# Patient Record
Sex: Female | Born: 1935 | Race: White | Hispanic: No | Smoking: Never smoker
Health system: Southern US, Community
[De-identification: ages and names within clinical notes are randomized; demographics above are authoritative.]

## PROBLEM LIST (undated history)

## (undated) DIAGNOSIS — I482 Chronic atrial fibrillation, unspecified: Secondary | ICD-10-CM

## (undated) DIAGNOSIS — E079 Disorder of thyroid, unspecified: Secondary | ICD-10-CM

## (undated) DIAGNOSIS — I10 Essential (primary) hypertension: Secondary | ICD-10-CM

## (undated) HISTORY — DX: Chronic atrial fibrillation, unspecified: I48.20

## (undated) HISTORY — PX: OTHER SURGICAL HISTORY: SHX169

---

## 2009-09-15 ENCOUNTER — Ambulatory Visit: Payer: Self-pay | Admitting: Cardiovascular Disease

## 2009-11-20 ENCOUNTER — Ambulatory Visit: Payer: Self-pay | Admitting: Cardiovascular Disease

## 2009-12-01 ENCOUNTER — Ambulatory Visit: Payer: Self-pay | Admitting: Cardiovascular Disease

## 2009-12-08 ENCOUNTER — Telehealth: Payer: Self-pay | Admitting: Cardiovascular Disease

## 2010-02-02 ENCOUNTER — Ambulatory Visit
Admission: RE | Admit: 2010-02-02 | Discharge: 2010-02-02 | Payer: Self-pay | Source: Home / Self Care | Attending: Cardiovascular Disease | Admitting: Cardiovascular Disease

## 2010-02-03 NOTE — Progress Notes (Signed)
  Phone Note Call from Patient   Caller: Patient Summary of Call: T.C. from patient-since starting Digoxin last week, HR ranging from 69(the lowest) to 115.  Basically staying above 85.  BP 150's/70's.  Wants to know if Digoxin may be causing this or if needs to wait for it to work. Initial call taken by: Dessie Coma  LPN,  December 08, 2009 11:08 AM  Follow-up for Phone Call        Phone Call Completed, Jearld Hemp Notified:  LMVM per Dr. Kirke Corin, this is fine for now.  To call back if any more questions. Follow-up by: Dessie Coma  LPN,  December 08, 2009 3:27 PM

## 2010-02-16 NOTE — Assessment & Plan Note (Signed)
[facility]                       [facility] OFFICE NOTE  NAME:[name], [name]                      MRN:          [id number] DATE:02/02/2010                            DOB:          05/31/1915   [name] [name] is an 77 year old female who is here today for a followup visit.  She has the following problem list that include chronic atrial fibrillation.  INTERVAL HISTORY:  [name] [name] is here today for a followup visit. Overall, she has been doing very well with current medications that include metoprolol and digoxin.  She was taken off sotalol a few months ago.  She feels much better than before.  She is able to perform more activities with less fatigue.  During her last visit I added digoxin due to heart rate between 100 to 110.  Since then, there has been significant improvement in her heart rate.  She has not had any readings above 100 at rest.  There has been no chest pain or dyspnea.  MEDICATIONS: 1. Warfarin as directed. 2. Calcium. 3. Vitamin B. 4. Metoprolol tartrate 50 mg twice daily. 5. Digoxin 0.125 mg once daily.  PHYSICAL EXAMINATION:  VITAL SIGNS:  Weight is 152.2 pounds, blood pressure is 121/72, pulse is 77, oxygen saturation is 96% on room air. NECK:  No JVD or carotid bruits. LUNGS:  Clear to auscultation. HEART:  Irregularly irregular with no gallops or murmurs. ABDOMEN:  Benign, nontender, and nondistended. EXTREMITIES:  With no clubbing, cyanosis, or edema.  IMPRESSION:  Chronic atrial fibrillation, currently with controlled heart rate.  She will continue also long-term anticoagulation with warfarin.  We will continue with metoprolol and digoxin for rate control.  No other changes will be made today.  She will follow up with me in 4 months from now or earlier as needed.    Lorine Bears, MD Electronically Signed   MA/MedQ  DD: 02/02/2010  DT: 02/02/2010  Job #: [id number]

## 2010-05-11 ENCOUNTER — Encounter: Payer: Self-pay | Admitting: Cardiovascular Disease

## 2010-05-14 ENCOUNTER — Encounter: Payer: Self-pay | Admitting: Cardiovascular Disease

## 2010-05-14 ENCOUNTER — Ambulatory Visit (INDEPENDENT_AMBULATORY_CARE_PROVIDER_SITE_OTHER): Payer: Medicare Other | Admitting: Cardiovascular Disease

## 2010-05-14 VITALS — BP 125/72 | HR 76 | Ht 68.0 in | Wt 142.0 lb

## 2010-05-14 DIAGNOSIS — I4891 Unspecified atrial fibrillation: Secondary | ICD-10-CM

## 2010-05-14 DIAGNOSIS — I482 Chronic atrial fibrillation, unspecified: Secondary | ICD-10-CM

## 2010-05-14 NOTE — Assessment & Plan Note (Signed)
The patient is doing very well at this time with controlled ventricular rate. She's not having any side effects to medications. She has no symptoms suggestive of angina. Would continue with rate control and long-term anticoagulation with a goal INR between 2 and 3. She will followup in 6 months or earlier if needed.

## 2010-05-14 NOTE — Patient Instructions (Signed)
Your physician recommends that you schedule a follow-up appointment in: 6 months  

## 2010-05-14 NOTE — Progress Notes (Signed)
HPI  This is a 77 year old pleasant female who is here today for a followup visit regarding had chronic atrial fibrillation. Overall, she has been doing very well with no reported chest pain, dyspnea, palpitations, dizziness or syncope. She reports no side effects to medications. She used to be on sotalol in the past but I switched her to a rate control with metoprolol and digoxin. Since then, she had significant improvement in her fatigue and overall feels better. She is taking warfarin for anticoagulation with therapeutic INR which is being managed by her primary care physician [facility]).  No Known Allergies   Current Outpatient Prescriptions on File Prior to Visit  Medication Sig Dispense Refill  . ALPRAZolam (XANAX) 0.25 MG tablet Take 0.25 mg by mouth as needed.        . B Complex-C (B-COMPLEX WITH VITAMIN C) tablet Take 1 tablet by mouth daily.        . calcium carbonate (OS-CAL) 600 MG TABS Take 600 mg by mouth 2 (two) times daily with a meal.        . digoxin (LANOXIN) 0.125 MG tablet Take 125 mcg by mouth daily.        . metoprolol (LOPRESSOR) 50 MG tablet Take 50 mg by mouth 2 (two) times daily.        Marland Kitchen warfarin (COUMADIN) 5 MG tablet Take 5 mg by mouth as directed.           Past Medical History  Diagnosis Date  . Chronic atrial fibrillation     used to be on Sotalol in past     Past Surgical History  Procedure Date  . Hysterectomy - unknown type      Family History  Problem Relation Age of Onset  . Heart failure    . Dementia       History   Social History  . Marital Status: Widowed    Spouse Name: N/A    Number of Children: 2  . Years of Education: N/A   Occupational History  . retired Engineer, civil (consulting)    Social History Main Topics  . Smoking status: Never Smoker   . Smokeless tobacco: Not on file  . Alcohol Use: No  . Drug Use: No  . Sexually Active: Not on file   Other Topics Concern  . Not on file   Social History Narrative  . No narrative on file        PHYSICAL EXAM   BP 125/72  Pulse 76  Ht 5\' 8"  (1.727 m)  Wt 142 lb (64.411 kg)  BMI 21.59 kg/m2  SpO2 96%  Constitutional: She is oriented to person, place, and time. She appears well-developed and well-nourished. No distress.  HENT: No nasal discharge.  Head: Normocephalic and atraumatic.  Eyes: Pupils are equal, round, and reactive to light. Right eye exhibits no discharge. Left eye exhibits no discharge.  Neck: Normal range of motion. Neck supple. No JVD present. No thyromegaly present.  Cardiovascular: Normal rate, regular rhythm, normal heart sounds and intact distal pulses. Exam reveals no gallop and no friction rub.  No murmur heard.  Pulmonary/Chest: Effort normal and breath sounds normal. No stridor. No respiratory distress. She has no wheezes. She has no rales. She exhibits no tenderness.  Abdominal: Soft. Bowel sounds are normal. She exhibits no distension. There is no tenderness. There is no rebound and no guarding.  Musculoskeletal: Normal range of motion. She exhibits no edema and no tenderness.  Neurological: She is alert and oriented  to person, place, and time. Coordination normal.  Skin: Skin is warm and dry. No rash noted. She is not diaphoretic. No erythema. No pallor.  Psychiatric: She has a normal mood and affect. Her behavior is normal. Judgment and thought content normal.      ASSESSMENT AND PLAN

## 2010-05-19 NOTE — Letter (Signed)
September 15, 2009    [name] Alken, NP  [address]  [address]  [place], [place] [address]   RE:  [name], [name]  MRN:  [id number]  /  DOB:  Apr 01, 1915   Dear Amy:   Thank you for referring [name] [name] for further cardiac evaluation.  As you are aware, this is a pleasant 77 year old female with a history  of paroxysmal atrial fibrillation which according to her was diagnosed  30 years ago.  She has been on sotalol and anticoagulation with warfarin  for the past 8 years.  Otherwise, she is actually a very healthy person  with no other chronic medical conditions.  There is no history of  coronary artery disease, stroke, diabetes, or chronic kidney disease.  Overall, she has been doing well but now she is transferring her cardiac  care.  Most recently she has noticed increased palpitations leading to  dizziness and lightheadedness.  She also feels weak with these episodes.  She monitors her pulse and the lowest heart rate she had was 55.  During  one of these episodes, she checked her blood pressure and it was a  systolic of 85.  As I mentioned, she has been on sotalol for 8 years  which for the most part has been controlling her symptoms.  There has  been some worsening in her palpitations recently but does not seem to be  interfering with her activities of daily living.  She is also asking  whether a pacemaker is needed.  There is no reported history of  presyncope or syncope.  She has been on anticoagulation with no reported  side effects.   PAST MEDICAL HISTORY:  Includes paroxysmal atrial fibrillation.   MEDICATIONS:  1. Warfarin as directed.  2. Sotalol 80 mg 1/2 a tablet twice daily.   ALLERGIES:  NO KNOWN DRUG ALLERGIES.   SOCIAL HISTORY:  Negative for smoking, alcohol, or recreational drug  use.  The patient is widowed and lives by herself.  She is a retired  Engineer, civil (consulting).   PAST SURGICAL HISTORY:  Includes a hysterectomy.   FAMILY HISTORY:  Negative for  premature coronary artery disease or  arrhythmia.   REVIEW OF SYSTEMS:  Remarkable for palpitations, dizziness, and fatigue.  There is also arthritis pain.  She has no dyspnea or chest pain.  There  is no syncope.  A full review of systems was performed and is otherwise  negative.   PHYSICAL EXAMINATION:  The patient appears to be younger than her stated  age and in no acute distress.  VITAL SIGNS:  Weight is 145 pounds.  Blood pressure is 138/66.  Pulse is  66.  Oxygen saturation is 98% on room air.  HEENT:  Normocephalic, atraumatic.  NECK:  Reveals no JVD or carotid bruits.  RESPIRATORY:  Normal respiratory effort with no use of accessory  muscles.  Auscultation reveals normal breath sounds.  CARDIOVASCULAR:  Regular rate with frequent premature beats.  There are  no gallops or murmurs.  ABDOMEN:  Benign, nontender, nondistended.  EXTREMITIES:  With no clubbing, cyanosis, or edema.  SKIN:  Warm and dry with no rash.  PSYCHIATRIC:  She is alert, oriented x3, with normal mood and affect.  MUSCULOSKELETAL:  There is normal muscle strength in the upper and lower  extremities.   Electrocardiogram was performed which showed sinus rhythm with frequent  PACs, low voltage, and poor R-wave progression in the anterior leads.   IMPRESSION:  Paroxysmal atrial fibrillation:  The patient had this for  many years and for the most part her symptoms have been controlled with  sotalol.  Most recently her palpitations have become more frequent and  seems to be a little bit bothering her.  There are no symptoms of  syncope or presyncope.  Her current EKG shows sinus rhythm with frequent  PACs.  The QT interval is not prolonged.  According to patient, she did  have workup with her cardiologist within the last year which included an  echocardiogram and a Holter monitor.  We will request these records so  that we do not repeat anymore testing.  Management options include  switching her to a different  antiarrhythmic medication such as Multaq.  I think that will depend on her previous workup, as well as the severity  of her symptoms.  Sotalol tends to cause more bradycardia than other  antiarrhythmic medications which might be causing some of her fatigue.  In that situation, switching to another medication might be worth  trying.  In all situations, I recommend continuing long-term  anticoagulation with warfarin due to her advanced age.  She did have  labs done recently which showed normal thyroid function and normal renal  function.  The patient will follow up with me in 2 months from now or  earlier if needed.  We did discuss the issue of a pacemaker.  Given that  the lowest heart rate that she had was 55 beats per minute and she  reports no syncope or presyncope, I do not see an indication for a  permanent pacemaker.  Occasionally, if her atrial fibrillation becomes  out of control and a higher dose of rate-controlling agent is needed,  then in that situation a pacemaker might be needed to achieve that.   Thank you for referring this pleasant patient to our practice.  Please  do not hesitate to call us with any questions or concerns.    Sincerely,      [name] Bears, MD  Electronically Signed    MA/MedQ  DD: 09/15/2009  DT: 09/15/2009  Job #: [id number]

## 2010-05-19 NOTE — Assessment & Plan Note (Signed)
[facility]                        [place] [facility] OFFICE NOTE   NAME:[name] [name]                      MRN:          [id number]  DATE:12/01/2009                            DOB:          September 12, 1915    Ms. [name] [name] is a 77 year old female who is here today for a followup  visit.  She has the following problem list:  1. Atrial fibrillation.  2. No previous history of coronary artery disease or stroke.   Ms. [name] [name] is here today for 1-week followup visit.  During her last  visit, I stopped sotalol and switched her to metoprolol tartrate 50 mg  twice a day.  I decided to treat her with rate control given that she  was in atrial fibrillation in spite of being on sotalol.  Since that  time, she had significant improvement in her symptoms.  She does not  feel tired anymore.  She has more energy and able to do more things.  Her palpitations are also less than before.  She does feel that her  heart is slightly fast but that does not seem to be interfering with her  activities of daily living.   MEDICATIONS:  1. Warfarin as directed.  2. Metoprolol tartrate 50 mg twice daily.  3. Calcium once a day.  4. Vitamin B.   PHYSICAL EXAMINATION:  VITAL SIGNS:  Weight is 145.2 pounds, blood  pressure is 116/67, pulse is 99, oxygen saturation is 97% on room air.  NECK:  Reveals no JVD or carotid bruits.  LUNGS:  Clear to auscultation.  HEART:  Irregularly irregular with no gallops or murmurs.  ABDOMEN:  Benign, nontender, nondistended.  EXTREMITIES:  With no clubbing, cyanosis, or edema.   IMPRESSION:  Atrial fibrillation, likely now in chronic phase.   I decided to treat her with rate control.  I stopped sotalol due to  concern about possible side effects.  Since then, she has been feeling  much better.  She had significant symptomatic improvement.  Her heart  rate by physical exam is actually between 100 to 110.  Thus, she does  seem to be  slightly tachycardic.  Due to that, I will go ahead and add  digoxin 0.125 mg once daily.  We will continue with long-term  anticoagulation with warfarin.  The patient will update [place] if  there is any change in her symptoms while on digoxin.  She has normal  renal function.  Her labs  in September with a creatinine of 0.84.  She will follow up with me in 2  months from now or earlier if needed.     [name] Bears, MD  Electronically Signed    MA/MedQ  DD: 12/01/2009  DT: 12/02/2009  Job #: [id number]

## 2010-05-19 NOTE — Assessment & Plan Note (Signed)
[facility]                        [place] [facility] OFFICE NOTE   NAME:Bradshaw, Teasha                      MRN:          [id number]  DATE:11/20/2009                            DOB:          June 25, 1915    Ms. Heinzel is a 77 year old female who is here today for a followup  visit.  She has the following problem list:  1. Paroxysmal atrial fibrillation.  2. No previous history of coronary artery disease or stroke.   Ms. Bribiesca is here today for a routine followup visit.  She has been  having increased symptoms of palpitations which has been progressive  over the last 6 months.  She has been on sotalol for many years.  She  feels that over the last 6 months, she has been having more palpitations  than before.  She continues to take long-term anticoagulation with  warfarin.  The palpitations are not associated with dizziness,  presyncope, or syncope.  There has been no chest pain or dyspnea.   MEDICATIONS:  1. Warfarin as directed.  2. Sotalol 40 mg twice daily.  3. Calcium once daily.  4. Vitamin B once daily.   ALLERGIES:  No known drug allergies.   PHYSICAL EXAMINATION:  VITAL SIGNS:  Weight is 145.2 pounds, blood  pressure is 131/84, pulse is 76, oxygen saturation is 97% on room air.  NECK:  No JVD or carotid bruits.  LUNGS:  Clear to auscultation.  HEART:  Irregularly irregular with tachycardia.  There are no gallops or  murmurs.  ABDOMEN:  Benign, nontender, nondistended.  EXTREMITIES:  With no clubbing, cyanosis, or edema.   Electrocardiogram was performed which showed atrial fibrillation with  rapid ventricular response.  Heart rate is 104 with nonspecific ST and T-  wave changes.  QT interval is normal.   IMPRESSION:  Atrial fibrillation, likely transitioning into a permanent  phase.  The patient has been on sotalol for many years.  However, over  the last 6 months, she has been having significant breakthrough  palpitations  likely indicative of atrial fibrillation.  Her most recent  cardiac evaluation was done in 2010.  Echocardiogram in December 2010  showed normal LV systolic function, moderately dilated left atrium, mild  mitral and tricuspid regurgitation as well as aortic sclerosis without  stenosis.  Her Holter monitor in March 2010 showed episodes of atrial  fibrillation but mostly sinus rhythm with frequent PACs.  I had a  discussion with the patient about management options which include  either rhythm control or rate control strategies.  It seems like most of  the time when she is in fibrillation, she is not very symptomatic except  for some palpitations.  Due to that and the fact that she is 89-year-  old, I think rate control will be sufficient as well as continuing long-  term anticoagulation to decrease her chance of future stroke.  Thus  today, I will go ahead and stop sotalol and switch her to metoprolol  tartrate 50 mg twice daily.  We will evaluate her response next week.  If we are having hard time controlling her rhythm  or if she becomes  bradycardic then I might consider switching to another antiarrhythmic  medication which most likely will be amiodarone in that situation.     Lorine Bears, MD  Electronically Signed    MA/MedQ  DD: 11/20/2009  DT: 11/21/2009  Job #: [id number]

## 2010-06-22 ENCOUNTER — Encounter: Payer: Self-pay | Admitting: Cardiovascular Disease

## 2010-07-20 ENCOUNTER — Other Ambulatory Visit: Payer: Self-pay | Admitting: *Deleted

## 2010-07-20 MED ORDER — DIGOXIN 125 MCG PO TABS: 125.0000 ug | ORAL_TABLET | Freq: Every day | ORAL | Status: DC

## 2010-12-10 ENCOUNTER — Other Ambulatory Visit: Payer: Self-pay | Admitting: Cardiovascular Disease

## 2011-02-22 ENCOUNTER — Other Ambulatory Visit: Payer: Self-pay | Admitting: Cardiovascular Disease

## 2011-05-05 ENCOUNTER — Encounter: Payer: Self-pay | Admitting: Cardiovascular Disease

## 2011-05-05 ENCOUNTER — Ambulatory Visit (INDEPENDENT_AMBULATORY_CARE_PROVIDER_SITE_OTHER): Payer: Medicare Other | Admitting: Cardiovascular Disease

## 2011-05-05 VITALS — BP 120/85 | HR 78 | Ht 67.0 in | Wt 143.0 lb

## 2011-05-05 DIAGNOSIS — I4891 Unspecified atrial fibrillation: Secondary | ICD-10-CM

## 2011-05-05 DIAGNOSIS — I482 Chronic atrial fibrillation, unspecified: Secondary | ICD-10-CM

## 2011-05-05 MED ORDER — DIGOXIN 125 MCG PO TABS: 125.0000 ug | ORAL_TABLET | Freq: Every day | ORAL | Status: DC

## 2011-05-05 MED ORDER — METOPROLOL TARTRATE 50 MG PO TABS: 50.0000 mg | ORAL_TABLET | Freq: Two times a day (BID) | ORAL | Status: DC

## 2011-05-05 NOTE — Progress Notes (Signed)
HPI  This is a 78 year old female who is here today for a followup visit. She has known history of chronic atrial fibrillation being treated with rate control and anticoagulation. She has been doing very well. She denies any dyspnea, chest pain or palpitations. She is very active and able to perform all activities of daily living. She continues to drive. She reports no side effects to medications.  No Known Allergies   Current Outpatient Prescriptions on File Prior to Visit  Medication Sig Dispense Refill  . B Complex-C (B-COMPLEX WITH VITAMIN C) tablet Take 1 tablet by mouth daily.        . calcium carbonate (OS-CAL) 600 MG TABS Take 600 mg by mouth 2 (two) times daily with a meal.        . warfarin (COUMADIN) 5 MG tablet Take 5 mg by mouth as directed.        Marland Kitchen DISCONTD: digoxin (LANOXIN) 0.125 MG tablet Take 1 tablet (125 mcg total) by mouth daily.  90 tablet  2  . DISCONTD: metoprolol (LOPRESSOR) 50 MG tablet Take 1 tablet (50 mg total) by mouth 2 (two) times daily.  60 tablet  1  . ALPRAZolam (XANAX) 0.25 MG tablet Take 0.25 mg by mouth as needed.           Past Medical History  Diagnosis Date  . Chronic atrial fibrillation     used to be on Sotalol in past     Past Surgical History  Procedure Date  . Hysterectomy - unknown type      Family History  Problem Relation Age of Onset  . Heart failure    . Dementia       History   Social History  . Marital Status: Widowed    Spouse Name: N/A    Number of Children: 2  . Years of Education: N/A   Occupational History  . retired Engineer, civil (consulting)    Social History Main Topics  . Smoking status: Never Smoker   . Smokeless tobacco: Not on file  . Alcohol Use: No  . Drug Use: No  . Sexually Active: Not on file   Other Topics Concern  . Not on file   Social History Narrative  . No narrative on file     PHYSICAL EXAM   BP 120/85  Pulse 78  Ht 5\' 7"  (1.702 m)  Wt 143 lb (64.864 kg)  BMI 22.40  kg/m2  Constitutional: She is oriented to person, place, and time. She appears well-developed and well-nourished. No distress.  HENT: No nasal discharge.  Head: Normocephalic and atraumatic.  Eyes: Pupils are equal and round. Right eye exhibits no discharge. Left eye exhibits no discharge.  Neck: Normal range of motion. Neck supple. No JVD present. No thyromegaly present.  Cardiovascular: Normal rate, irregular rhythm, normal heart sounds. Exam reveals no gallop and no friction rub. No murmur heard.  Pulmonary/Chest: Effort normal and breath sounds normal. No stridor. No respiratory distress. She has no wheezes. She has no rales. She exhibits no tenderness.  Abdominal: Soft. Bowel sounds are normal. She exhibits no distension. There is no tenderness. There is no rebound and no guarding.  Musculoskeletal: Normal range of motion. She exhibits no edema and no tenderness.  Neurological: She is alert and oriented to person, place, and time. Coordination normal.  Skin: Skin is warm and dry. No rash noted. She is not diaphoretic. No erythema. No pallor.  Psychiatric: She has a normal mood and affect. Her behavior  is normal. Judgment and thought content normal.    EKG: Atrial fibrillation with nonspecific ST changes suggestive of digoxin effect.   ASSESSMENT AND PLAN

## 2011-05-05 NOTE — Assessment & Plan Note (Signed)
The patient is doing very well at this time with controlled ventricular rate. She's not having any side effects to medications. She has no symptoms suggestive of angina. continue with rate control and long-term anticoagulation with a goal INR between 2 and 3. Most recent echocardiogram in 2010 showed normal LV systolic function, moderately dilated left atrium as well as mild mitral and tricuspid regurgitation. I recommended checking renal function and digoxin level once a year. She will followup in 6 months or earlier if needed.

## 2011-05-05 NOTE — Patient Instructions (Signed)
The current medical regimen is effective;  continue present plan and medications.  Follow up in 6 months with Dr Kirke Corin.  You will receive a letter in the mail 2 months before you are due.  Please call us when you receive this letter to schedule your follow up appointment.

## 2012-02-23 ENCOUNTER — Encounter: Payer: Self-pay | Admitting: Cardiovascular Disease

## 2012-02-23 ENCOUNTER — Ambulatory Visit (INDEPENDENT_AMBULATORY_CARE_PROVIDER_SITE_OTHER): Payer: Medicare Other | Admitting: Cardiovascular Disease

## 2012-02-23 VITALS — BP 110/60 | HR 76 | Ht 68.0 in | Wt 146.0 lb

## 2012-02-23 DIAGNOSIS — I482 Chronic atrial fibrillation, unspecified: Secondary | ICD-10-CM

## 2012-02-23 DIAGNOSIS — R600 Localized edema: Secondary | ICD-10-CM | POA: Insufficient documentation

## 2012-02-23 DIAGNOSIS — I4891 Unspecified atrial fibrillation: Secondary | ICD-10-CM

## 2012-02-23 MED ORDER — DIGOXIN 125 MCG PO TABS: 125.0000 ug | ORAL_TABLET | Freq: Every day | ORAL | Status: DC

## 2012-02-23 NOTE — Patient Instructions (Addendum)
Your physician wants you to follow-up in: 6 MONTHS WITH DR Kirke Corin You will receive a reminder letter in the mail two months in advance. If you don't receive a letter, please call our office to schedule the follow-up appointment.   USE SUPPORT HOSE DURING THE DAY AND REMOVE AT NIGHT  ELEVATE LEGS 15 MIN TWICE DAILY

## 2012-02-23 NOTE — Progress Notes (Signed)
HPI  This is a pleasant 79 year old female who is here today for a followup visit. She has known history of chronic atrial fibrillation being treated with rate control and anticoagulation. She has been doing very well. She denies any dyspnea, chest pain or palpitations. She is very active and able to perform all activities of daily living. She continues to drive. She reports no side effects to medications. Since last visit, she reports having pneumonia but did not require hospitalization. She also had a fall with laceration on her head but no other significant injuries. Her biggest issue today is lower extremity edema which is worse at the end of the day. She has no orthopnea or other cardiac symptoms.  No Known Allergies   Current Outpatient Prescriptions on File Prior to Visit  Medication Sig Dispense Refill  . B Complex-C (B-COMPLEX WITH VITAMIN C) tablet Take 1 tablet by mouth daily.        . calcium carbonate (OS-CAL) 600 MG TABS Take 600 mg by mouth 2 (two) times daily with a meal.        . metoprolol (LOPRESSOR) 50 MG tablet Take 1 tablet (50 mg total) by mouth 2 (two) times daily.  180 tablet  3  . warfarin (COUMADIN) 5 MG tablet Take 5 mg by mouth as directed.         No current facility-administered medications on file prior to visit.     Past Medical History  Diagnosis Date  . Chronic atrial fibrillation     used to be on Sotalol in past     Past Surgical History  Procedure Laterality Date  . Hysterectomy - unknown type       Family History  Problem Relation Age of Onset  . Heart failure    . Dementia       History   Social History  . Marital Status: Widowed    Spouse Name: N/A    Number of Children: 2  . Years of Education: N/A   Occupational History  . retired Engineer, civil (consulting)    Social History Main Topics  . Smoking status: Never Smoker   . Smokeless tobacco: Not on file  . Alcohol Use: No  . Drug Use: No  . Sexually Active: Not on file   Other Topics  Concern  . Not on file   Social History Narrative  . No narrative on file     PHYSICAL EXAM   BP 110/60  Pulse 76  Ht 5\' 8"  (1.727 m)  Wt 146 lb (66.225 kg)  BMI 22.2 kg/m2  Constitutional: She is oriented to person, place, and time. She appears well-developed and well-nourished. No distress.  HENT: No nasal discharge.  Head: Normocephalic and atraumatic.  Eyes: Pupils are equal and round. Right eye exhibits no discharge. Left eye exhibits no discharge.  Neck: Normal range of motion. Neck supple. No JVD present. No thyromegaly present.  Cardiovascular: Normal rate, irregular rhythm, normal heart sounds. Exam reveals no gallop and no friction rub. No murmur heard.  Pulmonary/Chest: Effort normal and breath sounds normal. No stridor. No respiratory distress. She has no wheezes. She has no rales. She exhibits no tenderness.  Abdominal: Soft. Bowel sounds are normal. She exhibits no distension. There is no tenderness. There is no rebound and no guarding.  Musculoskeletal: Normal range of motion. She exhibits mild edema and no tenderness.  Neurological: She is alert and oriented to person, place, and time. Coordination normal.  Skin: Skin is warm and  dry. No rash noted. She is not diaphoretic. No erythema. No pallor.  Psychiatric: She has a normal mood and affect. Her behavior is normal. Judgment and thought content normal.    EKG: Atrial fibrillation with nonspecific ST changes suggestive of digoxin effect.   ASSESSMENT AND PLAN

## 2012-02-23 NOTE — Assessment & Plan Note (Signed)
I suspect that this is due to chronic venous insufficiency. I advised her to elevate her legs 2-3 times a day for at least 15 minutes. I also recommended a knee-high low-pressure support stocking to be applied during the day. I will try to avoid diuretics if possible.

## 2012-02-23 NOTE — Assessment & Plan Note (Addendum)
She is doing very well from a cardiac standpoint. Ventricular rate is controlled on current medications. Continue long-term anticoagulation. I advised her to be very careful about falls and trauma given that she is on warfarin. She gets routine labs done with her primary care physician, Amy Moon in [place]. I recommend checking CBC, basic metabolic profile and digoxin level at least once a year.

## 2012-04-17 ENCOUNTER — Other Ambulatory Visit: Payer: Self-pay | Admitting: Cardiovascular Disease

## 2012-09-19 ENCOUNTER — Encounter: Payer: Self-pay | Admitting: Cardiovascular Disease

## 2012-09-19 ENCOUNTER — Ambulatory Visit (INDEPENDENT_AMBULATORY_CARE_PROVIDER_SITE_OTHER): Payer: Medicare Other | Admitting: Cardiovascular Disease

## 2012-09-19 VITALS — BP 122/62 | HR 64 | Ht 68.0 in | Wt 141.0 lb

## 2012-09-19 DIAGNOSIS — R296 Repeated falls: Secondary | ICD-10-CM | POA: Insufficient documentation

## 2012-09-19 DIAGNOSIS — I4891 Unspecified atrial fibrillation: Secondary | ICD-10-CM

## 2012-09-19 DIAGNOSIS — I482 Chronic atrial fibrillation, unspecified: Secondary | ICD-10-CM

## 2012-09-19 DIAGNOSIS — R600 Localized edema: Secondary | ICD-10-CM

## 2012-09-19 DIAGNOSIS — W19XXXA Unspecified fall, initial encounter: Secondary | ICD-10-CM | POA: Insufficient documentation

## 2012-09-19 DIAGNOSIS — R609 Edema, unspecified: Secondary | ICD-10-CM

## 2012-09-19 LAB — BASIC METABOLIC PANEL
BUN: 15 mg/dL (ref 6–23)
Creatinine, Ser: 0.9 mg/dL (ref 0.4–1.2)
GFR: 65.19 mL/min (ref >60.00)

## 2012-09-19 NOTE — Patient Instructions (Addendum)
Your physician has recommended that you wear a 48 hour holter monitor. Holter monitors are medical devices that record the heart's electrical activity. Doctors most often use these monitors to diagnose arrhythmias. Arrhythmias are problems with the speed or rhythm of the heartbeat. The monitor is a small, portable device. You can wear one while you do your normal daily activities. This is usually used to diagnose what is causing palpitations/syncope (passing out).  Your physician recommends that you have lab work today: BMP and Digoxin  Your physician wants you to follow-up in: 6 MONTHS with Dr Kirke Corin.  You will receive a reminder letter in the mail two months in advance. If you don't receive a letter, please call our office to schedule the follow-up appointment.  Your physician recommends that you continue on your current medications as directed. Please refer to the Current Medication list given to you today.  Order given to the patient for walker with wheels.

## 2012-09-19 NOTE — Progress Notes (Signed)
HPI  This is a pleasant 79 year old female who is here today for a followup visit. She has known history of chronic atrial fibrillation being treated with rate control and anticoagulation. She has been doing very well. She denies any dyspnea, chest pain or palpitations. She is very active and able to perform all activities of daily living. She stopped driving. She lives in an assisted living facility.  No extremity edema improved with support stockings. She denies chest pain, dyspnea or palpitations. She had another fall without physical injury. These falls are happening mostly due to tripping and poor balance and not due to syncope. She complains of being "lifeless"with no energy in the morning after she wakes up. This improved gradually throughout the day. She takes digoxin at night.   No Known Allergies   Current Outpatient Prescriptions on File Prior to Visit  Medication Sig Dispense Refill  . B Complex-C (B-COMPLEX WITH VITAMIN C) tablet Take 1 tablet by mouth daily.        . calcium carbonate (OS-CAL) 600 MG TABS Take 600 mg by mouth 2 (two) times daily with a meal.        . digoxin (LANOXIN) 0.125 MG tablet Take 1 tablet (125 mcg total) by mouth daily.  90 tablet  3  . metoprolol (LOPRESSOR) 50 MG tablet TAKE ONE TABLET BY MOUTH TWICE DAILY  180 tablet  1   No current facility-administered medications on file prior to visit.     Past Medical History  Diagnosis Date  . Chronic atrial fibrillation     used to be on Sotalol in past     Past Surgical History  Procedure Laterality Date  . Hysterectomy - unknown type       Family History  Problem Relation Age of Onset  . Heart failure    . Dementia       History   Social History  . Marital Status: Widowed    Spouse Name: N/A    Number of Children: 2  . Years of Education: N/A   Occupational History  . retired Engineer, civil (consulting)    Social History Main Topics  . Smoking status: Never Smoker   . Smokeless tobacco: Not on file   . Alcohol Use: No  . Drug Use: No  . Sexual Activity: Not on file   Other Topics Concern  . Not on file   Social History Narrative  . No narrative on file     PHYSICAL EXAM   BP 122/62  Pulse 64  Ht 5\' 8"  (1.727 m)  Wt 141 lb (63.957 kg)  BMI 21.44 kg/m2  SpO2 96%  Constitutional: She is oriented to person, place, and time. She appears well-developed and well-nourished. No distress.  HENT: No nasal discharge.  Head: Normocephalic and atraumatic.  Eyes: Pupils are equal and round. Right eye exhibits no discharge. Left eye exhibits no discharge.  Neck: Normal range of motion. Neck supple. No JVD present. No thyromegaly present.  Cardiovascular: Normal rate, irregular rhythm, normal heart sounds. Exam reveals no gallop and no friction rub. No murmur heard.  Pulmonary/Chest: Effort normal and breath sounds normal. No stridor. No respiratory distress. She has no wheezes. She has no rales. She exhibits no tenderness.  Abdominal: Soft. Bowel sounds are normal. She exhibits no distension. There is no tenderness. There is no rebound and no guarding.  Musculoskeletal: Normal range of motion. She exhibits mild edema and no tenderness.  Neurological: She is alert and oriented to person, place, and  time. Coordination normal.  Skin: Skin is warm and dry. No rash noted. She is not diaphoretic. No erythema. No pallor.  Psychiatric: She has a normal mood and affect. Her behavior is normal. Judgment and thought content normal.    EKG: Atrial fibrillation with ventricular rate of 64 beats per minute  ASSESSMENT AND PLAN

## 2012-09-19 NOTE — Assessment & Plan Note (Signed)
She seems to be doing well from a cardiac standpoint. However, she complains of significant fatigue in the early morning hours. She takes digoxin at night. I think it's important to rule out significant bradycardia as a culprit. I will obtain a 48-hour Holter monitor to evaluate this and consider decreasing the dose of metoprolol on digoxin. I will also check digoxin level and basic metabolic profile.

## 2012-09-19 NOTE — Assessment & Plan Note (Signed)
She was given a prescription for a walker.

## 2012-09-19 NOTE — Assessment & Plan Note (Signed)
This improved with support stockings.   

## 2012-09-20 LAB — DIGOXIN LEVEL: Digoxin Level: 0.5 ng/mL — ABNORMAL LOW (ref 0.8–2.0)

## 2012-09-22 ENCOUNTER — Encounter: Payer: Self-pay | Admitting: Cardiovascular Disease

## 2012-09-22 NOTE — Telephone Encounter (Signed)
New problem:  Patient calling regarding test results.  

## 2012-09-22 NOTE — Telephone Encounter (Signed)
This encounter was created in error - please disregard.

## 2012-09-26 ENCOUNTER — Encounter: Payer: Self-pay | Admitting: *Deleted

## 2012-09-26 ENCOUNTER — Encounter (INDEPENDENT_AMBULATORY_CARE_PROVIDER_SITE_OTHER): Payer: Medicare Other

## 2012-09-26 DIAGNOSIS — I4891 Unspecified atrial fibrillation: Secondary | ICD-10-CM

## 2012-09-26 NOTE — Progress Notes (Signed)
Patient ID: [name] [name], female   DOB: Jul 07, 1915, 79 y.o.   MRN: [id number] E-Cardio 48 Hour Holter Monitor applied to patient.

## 2012-10-03 ENCOUNTER — Telehealth: Payer: Self-pay | Admitting: *Deleted

## 2012-10-03 NOTE — Telephone Encounter (Signed)
Results of 48 hr monitor reviewed with pt/ afib w/ occ rvr, avr hr 78 w nite time 2.5 sec pause, same medications. Pt verbalized understanding.

## 2012-11-03 ENCOUNTER — Other Ambulatory Visit: Payer: Self-pay | Admitting: *Deleted

## 2012-11-03 ENCOUNTER — Other Ambulatory Visit: Payer: Self-pay | Admitting: Cardiovascular Disease

## 2012-11-03 MED ORDER — METOPROLOL TARTRATE 50 MG PO TABS: ORAL_TABLET | ORAL | Status: DC

## 2012-11-03 NOTE — Telephone Encounter (Signed)
Requested Prescriptions   Signed Prescriptions Disp Refills  . metoprolol (LOPRESSOR) 50 MG tablet 180 tablet 1    Sig: TAKE ONE TABLET BY MOUTH TWICE DAILY    Authorizing Provider: Lorine Bears A    Ordering User: Kendrick Fries

## 2013-03-20 ENCOUNTER — Ambulatory Visit (INDEPENDENT_AMBULATORY_CARE_PROVIDER_SITE_OTHER): Payer: Medicare Other | Admitting: Cardiovascular Disease

## 2013-03-20 ENCOUNTER — Encounter: Payer: Self-pay | Admitting: Cardiovascular Disease

## 2013-03-20 VITALS — BP 100/54 | HR 64 | Ht 68.0 in | Wt 138.0 lb

## 2013-03-20 DIAGNOSIS — I4891 Unspecified atrial fibrillation: Secondary | ICD-10-CM

## 2013-03-20 DIAGNOSIS — I482 Chronic atrial fibrillation, unspecified: Secondary | ICD-10-CM

## 2013-03-20 NOTE — Patient Instructions (Signed)
Your physician wants you to follow-up in:   6 MONTHS WITH  DR  ARIDA You will receive a reminder letter in the mail two months in advance. If you don't receive a letter, please call our office to schedule the follow-up appointment. Your physician recommends that you continue on your current medications as directed. Please refer to the Current Medication list given to you today.  

## 2013-03-20 NOTE — Assessment & Plan Note (Signed)
She is doing very well. Ventricular rate is well controlled on metoprolol and digoxin. Digoxin level was 0.5 when it was checked 6 months ago. I plan on keeping the level less than 1. She is on long-term anticoagulation with warfarin.

## 2013-03-20 NOTE — Progress Notes (Signed)
HPI  This is a pleasant 80 year old female who is here today for a followup visit. She has known history of chronic atrial fibrillation being treated with rate control and anticoagulation. She has been doing very well. She denies any dyspnea, chest pain or palpitations. She is very active and able to perform all activities of daily living. She stopped driving. She lives in an assisted living facility.  Lower extremity edema improved with support stockings.  Labs in 09/104 showed a digoxin level of 0.5 which is optimal. Holter monitor showed good rate control with average HR of 76.  She had the flu 5-6 weeks ago but she recovered. She is feeling close to baseline.    No Known Allergies   Current Outpatient Prescriptions on File Prior to Visit  Medication Sig Dispense Refill  . B Complex-C (B-COMPLEX WITH VITAMIN C) tablet Take 1 tablet by mouth daily.        . calcium carbonate (OS-CAL) 600 MG TABS Take 600 mg by mouth 2 (two) times daily with a meal.        . digoxin (LANOXIN) 0.125 MG tablet Take 1 tablet (125 mcg total) by mouth daily.  90 tablet  3  . metoprolol (LOPRESSOR) 50 MG tablet TAKE ONE TABLET BY MOUTH TWICE DAILY  180 tablet  1  . warfarin (COUMADIN) 2.5 MG tablet Take 2.5 mg by mouth. AS DIRECTED BY [facility]       No current facility-administered medications on file prior to visit.     Past Medical History  Diagnosis Date  . Chronic atrial fibrillation     used to be on Sotalol in past     Past Surgical History  Procedure Laterality Date  . Hysterectomy - unknown type       Family History  Problem Relation Age of Onset  . Heart failure    . Dementia       History   Social History  . Marital Status: Widowed    Spouse Name: N/A    Number of Children: 2  . Years of Education: N/A   Occupational History  . retired Marine scientist    Social History Main Topics  . Smoking status: Never Smoker   . Smokeless tobacco: Not on file  . Alcohol Use: No  .  Drug Use: No  . Sexual Activity: Not on file   Other Topics Concern  . Not on file   Social History Narrative  . No narrative on file     PHYSICAL EXAM   BP 100/54  Pulse 64  Ht 5\' 8"  (1.727 m)  Wt 138 lb (62.596 kg)  BMI 20.99 kg/m2  Constitutional: She is oriented to person, place, and time. She appears well-developed and well-nourished. No distress.  HENT: No nasal discharge.  Head: Normocephalic and atraumatic.  Eyes: Pupils are equal and round. Right eye exhibits no discharge. Left eye exhibits no discharge.  Neck: Normal range of motion. Neck supple. No JVD present. No thyromegaly present.  Cardiovascular: Normal rate, irregular rhythm, normal heart sounds. Exam reveals no gallop and no friction rub. No murmur heard.  Pulmonary/Chest: Effort normal and breath sounds normal. No stridor. No respiratory distress. She has no wheezes. She has no rales. She exhibits no tenderness.  Abdominal: Soft. Bowel sounds are normal. She exhibits no distension. There is no tenderness. There is no rebound and no guarding.  Musculoskeletal: Normal range of motion. She exhibits mild edema and no tenderness.  Neurological: She is alert  and oriented to person, place, and time. Coordination normal.  Skin: Skin is warm and dry. No rash noted. She is not diaphoretic. No erythema. No pallor.  Psychiatric: She has a normal mood and affect. Her behavior is normal. Judgment and thought content normal.    EKG: Atrial fibrillation with ventricular rate of 64 beats per minute  ASSESSMENT AND PLAN

## 2013-04-30 ENCOUNTER — Other Ambulatory Visit: Payer: Self-pay | Admitting: Cardiovascular Disease

## 2013-05-01 ENCOUNTER — Other Ambulatory Visit: Payer: Self-pay | Admitting: Cardiovascular Disease

## 2013-10-22 ENCOUNTER — Other Ambulatory Visit: Payer: Self-pay | Admitting: Cardiovascular Disease

## 2013-12-11 ENCOUNTER — Encounter: Payer: Self-pay | Admitting: Cardiovascular Disease

## 2013-12-11 ENCOUNTER — Ambulatory Visit (INDEPENDENT_AMBULATORY_CARE_PROVIDER_SITE_OTHER): Payer: Medicare Other | Admitting: Cardiovascular Disease

## 2013-12-11 VITALS — BP 104/60 | HR 68 | Ht 68.0 in | Wt 137.0 lb

## 2013-12-11 DIAGNOSIS — I482 Chronic atrial fibrillation, unspecified: Secondary | ICD-10-CM

## 2013-12-11 DIAGNOSIS — R600 Localized edema: Secondary | ICD-10-CM

## 2013-12-11 MED ORDER — DIGOXIN 125 MCG PO TABS: 125.0000 ug | ORAL_TABLET | Freq: Every day | ORAL | Status: DC

## 2013-12-11 MED ORDER — METOPROLOL TARTRATE 50 MG PO TABS: ORAL_TABLET | ORAL | Status: DC

## 2013-12-11 NOTE — Assessment & Plan Note (Signed)
She is doing very well with rate control. I recommend a digoxin level of less than 1. This should be checked every 6-12 months. Continue long-term anticoagulation with warfarin.

## 2013-12-11 NOTE — Assessment & Plan Note (Signed)
This improved with support stockings.

## 2013-12-11 NOTE — Progress Notes (Signed)
    HPI  This is a pleasant 80 year old female who is here today for a followup visit. She has known history of chronic atrial fibrillation being treated with rate control and anticoagulation. She has been doing very well. She denies any dyspnea, chest pain or palpitations. She is very active and able to perform all activities of daily living. She stopped driving. She lives in an assisted living facility.  Lower extremity edema improved with support stockings.  She has been doing very well and denies chest pain, shortness of breath or palpitations.   No Known Allergies   Current Outpatient Prescriptions on File Prior to Visit  Medication Sig Dispense Refill  . B Complex-C (B-COMPLEX WITH VITAMIN C) tablet Take 1 tablet by mouth daily.      . calcium carbonate (OS-CAL) 600 MG TABS Take 600 mg by mouth 2 (two) times daily with a meal.      . warfarin (COUMADIN) 2.5 MG tablet Take 2.5 mg by mouth. AS DIRECTED BY [facility] CLIN[facility]     No current facility-administered medications on file prior to visit.     Past Medical History  Diagnosis Date  . Chronic atrial fibrillation     used to be on Sotalol in past     Past Surgical History  Procedure Laterality Date  . Hysterectomy - unknown type       Family History  Problem Relation Age of Onset  . Heart failure    . Dementia       History   Social History  . Marital Status: Widowed    Spouse Name: N/A    Number of Children: 2  . Years of Education: N/A   Occupational History  . retired Marine scientist    Social History Main Topics  . Smoking status: Never Smoker   . Smokeless tobacco: Not on file  . Alcohol Use: No  . Drug Use: No  . Sexual Activity: Not on file   Other Topics Concern  . Not on file   Social History Narrative     PHYSICAL EXAM   BP 104/60 mmHg  Pulse 68  Ht 5\' 8"  (1.727 m)  Wt 137 lb (62.143 kg)  BMI 20.84 kg/m2  Constitutional: She is oriented to person, place, and time. She appears  well-developed and well-nourished. No distress.  HENT: No nasal discharge.  Head: Normocephalic and atraumatic.  Eyes: Pupils are equal and round. Right eye exhibits no discharge. Left eye exhibits no discharge.  Neck: Normal range of motion. Neck supple. No JVD present. No thyromegaly present.  Cardiovascular: Normal rate, irregular rhythm, normal heart sounds. Exam reveals no gallop and no friction rub. No murmur heard.  Pulmonary/Chest: Effort normal and breath sounds normal. No stridor. No respiratory distress. She has no wheezes. She has no rales. She exhibits no tenderness.  Abdominal: Soft. Bowel sounds are normal. She exhibits no distension. There is no tenderness. There is no rebound and no guarding.  Musculoskeletal: Normal range of motion. She exhibits mild edema and no tenderness.  Neurological: She is alert and oriented to person, place, and time. Coordination normal.  Skin: Skin is warm and dry. No rash noted. She is not diaphoretic. No erythema. No pallor.  Psychiatric: She has a normal mood and affect. Her behavior is normal. Judgment and thought content normal.      ASSESSMENT AND PLAN

## 2013-12-11 NOTE — Patient Instructions (Signed)
Continue same medications.   Your physician wants you to follow-up in: 6 months.  You will receive a reminder letter in the mail two months in advance. If you don't receive a letter, please call our office to schedule the follow-up appointment.  

## 2014-04-24 ENCOUNTER — Other Ambulatory Visit: Payer: Self-pay | Admitting: Cardiovascular Disease

## 2014-06-11 ENCOUNTER — Encounter: Payer: Self-pay | Admitting: Cardiovascular Disease

## 2014-06-11 ENCOUNTER — Ambulatory Visit (INDEPENDENT_AMBULATORY_CARE_PROVIDER_SITE_OTHER): Payer: Medicare Other | Admitting: Cardiovascular Disease

## 2014-06-11 VITALS — BP 110/60 | HR 63 | Ht 68.0 in | Wt 136.0 lb

## 2014-06-11 DIAGNOSIS — I482 Chronic atrial fibrillation, unspecified: Secondary | ICD-10-CM

## 2014-06-11 NOTE — Assessment & Plan Note (Signed)
She is doing very well with rate control. I recommend a digoxin level of less than 1. This should be checked every 6-12 months. Continue long-term anticoagulation with warfarin . Target INR is between 2 and 3.

## 2014-06-11 NOTE — Patient Instructions (Signed)
Medication Instructions: Continue same medications.   Labwork: None.   Procedures/Testing: None.   Follow-Up: 6 months with Dr. Zayquan Bogard.   Any Additional Special Instructions Will Be Listed Below (If Applicable).   

## 2014-06-11 NOTE — Progress Notes (Signed)
HPI  This is a pleasant 81 year old female who is here today for a followup visit. She has known history of chronic atrial fibrillation being treated with rate control and anticoagulation. She has been doing very well. She denies any dyspnea, chest pain or palpitations. She is very active and able to perform all activities of daily living. She does not drive anymore. She lives in an assisted living facility.   No dizziness, syncope or presyncope.  No Known Allergies   Current Outpatient Prescriptions on File Prior to Visit  Medication Sig Dispense Refill  . B Complex-C (B-COMPLEX WITH VITAMIN C) tablet Take 1 tablet by mouth daily.      . calcium carbonate (OS-CAL) 600 MG TABS Take 600 mg by mouth 2 (two) times daily with a meal.      . digoxin (DIGOX) 0.125 MG tablet Take 1 tablet (125 mcg total) by mouth daily. 90 tablet 3  . metoprolol (LOPRESSOR) 50 MG tablet TAKE ONE TABLET BY MOUTH TWICE DAILY 180 tablet 3  . warfarin (COUMADIN) 2.5 MG tablet Take 2.5 mg by mouth. AS DIRECTED BY [facility]ADIN CLINIC     No current facility-administered medications on file prior to visit.     Past Medical History  Diagnosis Date  . Chronic atrial fibrillation     used to be on Sotalol in past     Past Surgical History  Procedure Laterality Date  . Hysterectomy - unknown type       Family History  Problem Relation Age of Onset  . Heart failure    . Dementia       History   Social History  . Marital Status: Widowed    Spouse Name: N/A  . Number of Children: 2  . Years of Education: N/A   Occupational History  . retired Marine scientist    Social History Main Topics  . Smoking status: Never Smoker   . Smokeless tobacco: Not on file  . Alcohol Use: No  . Drug Use: No  . Sexual Activity: Not on file   Other Topics Concern  . Not on file   Social History Narrative     PHYSICAL EXAM   BP 110/60 mmHg  Pulse 63  Ht 5\' 8"  (1.727 m)  Wt 136 lb (61.689 kg)  BMI 20.68  kg/m2  Constitutional: She is oriented to person, place, and time. She appears well-developed and well-nourished. No distress.  HENT: No nasal discharge.  Head: Normocephalic and atraumatic.  Eyes: Pupils are equal and round. Right eye exhibits no discharge. Left eye exhibits no discharge.  Neck: Normal range of motion. Neck supple. No JVD present. No thyromegaly present.  Cardiovascular: Normal rate, irregular rhythm, normal heart sounds. Exam reveals no gallop and no friction rub. No murmur heard.  Pulmonary/Chest: Effort normal and breath sounds normal. No stridor. No respiratory distress. She has no wheezes. She has no rales. She exhibits no tenderness.  Abdominal: Soft. Bowel sounds are normal. She exhibits no distension. There is no tenderness. There is no rebound and no guarding.  Musculoskeletal: Normal range of motion. She exhibits mild edema and no tenderness.  Neurological: She is alert and oriented to person, place, and time. Coordination normal.  Skin: Skin is warm and dry. No rash noted. She is not diaphoretic. No erythema. No pallor.  Psychiatric: She has a normal mood and affect. Her behavior is normal. Judgment and thought content normal.    EKG: Atrial fibrillation  Low voltage -possible pulmonary disease.  ABNORMAL   ASSESSMENT AND PLAN

## 2014-10-21 ENCOUNTER — Other Ambulatory Visit: Payer: Self-pay | Admitting: Cardiovascular Disease

## 2015-01-13 ENCOUNTER — Ambulatory Visit: Payer: PRIVATE HEALTH INSURANCE | Admitting: Cardiovascular Disease

## 2015-01-15 ENCOUNTER — Encounter: Payer: Self-pay | Admitting: *Deleted

## 2015-03-18 ENCOUNTER — Ambulatory Visit: Payer: PRIVATE HEALTH INSURANCE | Admitting: Cardiovascular Disease

## 2015-03-25 ENCOUNTER — Ambulatory Visit (INDEPENDENT_AMBULATORY_CARE_PROVIDER_SITE_OTHER): Payer: Medicare Other | Admitting: Cardiovascular Disease

## 2015-03-25 ENCOUNTER — Encounter: Payer: Self-pay | Admitting: Cardiovascular Disease

## 2015-03-25 VITALS — BP 120/58 | HR 78 | Ht 67.5 in | Wt 137.0 lb

## 2015-03-25 DIAGNOSIS — I482 Chronic atrial fibrillation, unspecified: Secondary | ICD-10-CM

## 2015-03-25 NOTE — Patient Instructions (Signed)
Medication Instructions: Continue same medications.   Labwork: None.   Procedures/Testing: None.   Follow-Up: 6 months with Dr. Akshaya Toepfer.   Any Additional Special Instructions Will Be Listed Below (If Applicable).     If you need a refill on your cardiac medications before your next appointment, please call your pharmacy.   

## 2015-03-25 NOTE — Progress Notes (Signed)
Cardiology Office Note   Date:  03/25/2015   ID:  [name] [name], DOB 04/05/1915, MRN [id number]  PCP:  Laverna Peace, NP  Cardiologist:   Kathlyn Sacramento, MD   Chief Complaint  Patient presents with  . Other    6 month f/u. Meds reviewed verbally with pt.      History of Present Illness: [name] [name] is a 82 y.o. female who presents for a followup visit regarding chronic atrial fibrillation treated with rate control and warfarin anticoagulation. She has been doing very well. She denies any dyspnea, chest pain or palpitations. She is very active and able to perform all activities of daily living. She does not drive anymore. She lives in an assisted living facility.   No dizziness, syncope or presyncope.   Past Medical History  Diagnosis Date  . Chronic atrial fibrillation (HCC)     used to be on Sotalol in past    Past Surgical History  Procedure Laterality Date  . Hysterectomy - unknown type       Current Outpatient Prescriptions  Medication Sig Dispense Refill  . B Complex-C (B-COMPLEX WITH VITAMIN C) tablet Take 1 tablet by mouth daily.      . calcium carbonate (OS-CAL) 600 MG TABS Take 600 mg by mouth 2 (two) times daily with a meal.      . DIGOX 125 MCG tablet TAKE ONE TABLET BY MOUTH EVERY DAY 90 tablet 2  . metoprolol (LOPRESSOR) 50 MG tablet TAKE ONE TABLET BY MOUTH TWICE DAILY 180 tablet 3  . warfarin (COUMADIN) 2.5 MG tablet Take 2.5 mg by mouth. AS DIRECTED BY [facility] CL[facility]     No current facility-administered medications for this visit.    Allergies:   Review of patient's allergies indicates no known allergies.    Social History:  The patient  reports that she has never smoked. She does not have any smokeless tobacco history on file. She reports that she does not drink alcohol or use illicit drugs.     ROS:  Please see the history of present illness.     All other systems are reviewed and negative.    PHYSICAL EXAM: VS:  BP 120/58 mmHg  Pulse  78  Ht 5' 7.5" (1.715 m)  Wt 137 lb (62.143 kg)  BMI 21.13 kg/m2 , BMI Body mass index is 21.13 kg/(m^2). GEN: Well nourished, well developed, in no acute distress HEENT: normal Neck: no JVD, carotid bruits, or masses Cardiac: Irregularly irregular; no murmurs, rubs, or gallops,no edema  Respiratory:  clear to auscultation bilaterally, normal work of breathing GI: soft, nontender, nondistended, + BS MS: no deformity or atrophy Skin: warm and dry, no rash Neuro:  Strength and sensation are intact Psych: euthymic mood, full affect   EKG:  EKG is ordered today. The ekg ordered today demonstrates atrial fibrillation with ST changes due to digoxin.   Recent Labs: No results found for requested labs within last 365 days.    Lipid Panel No results found for: CHOL, TRIG, HDL, CHOLHDL, VLDL, LDLCALC, LDLDIRECT    Wt Readings from Last 3 Encounters:  03/25/15 137 lb (62.143 kg)  06/11/14 136 lb (61.689 kg)  12/11/13 137 lb (62.143 kg)      Other studies Reviewed: Additional studies/ records that were reviewed today include: Labs from this month. Review of the above records demonstrates: Digoxin level is 0.5, normal renal function with a creatinine of 0.76, unremarkable CBC except for a platelet count of 146,000   ASSESSMENT  AND PLAN:  1.  Chronic atrial fibrillation: She is doing extremely well with rate control. She is currently on metoprolol 50 mg twice daily and small dose digoxin with optimal level. She is tolerating long-term anticoagulation with warfarin. I made no changes today.    Disposition:   FU with me in 6 months  Signed,  Kathlyn Sacramento, MD  03/25/2015 5:03 PM    [place]

## 2015-04-15 ENCOUNTER — Other Ambulatory Visit: Payer: Self-pay | Admitting: Cardiovascular Disease

## 2015-07-14 ENCOUNTER — Other Ambulatory Visit: Payer: Self-pay | Admitting: Cardiovascular Disease

## 2015-09-25 ENCOUNTER — Encounter: Payer: Self-pay | Admitting: Cardiovascular Disease

## 2015-09-25 ENCOUNTER — Ambulatory Visit (INDEPENDENT_AMBULATORY_CARE_PROVIDER_SITE_OTHER): Payer: Medicare Other | Admitting: Cardiovascular Disease

## 2015-09-25 VITALS — BP 128/64 | HR 72 | Ht 69.0 in | Wt 137.5 lb

## 2015-09-25 DIAGNOSIS — I482 Chronic atrial fibrillation, unspecified: Secondary | ICD-10-CM

## 2015-09-25 NOTE — Progress Notes (Signed)
Cardiology Office Note   Date:  09/25/2015   ID:  [name] [name], DOB [birth year], MRN [id number]  PCP:  [name] Peace, NP  Cardiologist:   Kathlyn Sacramento, MD   Chief Complaint  Patient presents with  . other    6 month follow up. overall " doing well" . Pt refused EKG. Meds reviewed with pt verbally.       History of Present Illness: [name] [name] is a 82 y.o. female who presents for a followup visit regarding chronic atrial fibrillation treated with rate control and warfarin anticoagulation. She has been doing very well. She denies any dyspnea, chest pain or palpitations. She is very active and able to perform all activities of daily living. She does not drive anymore. She lives in an assisted living facility.   She continues to take warfarin for anticoagulation with no side effects. She spends most of her time with her daughter who lives about 11 miles away.  Past Medical History:  Diagnosis Date  . Chronic atrial fibrillation (HCC)    used to be on Sotalol in past    Past Surgical History:  Procedure Laterality Date  . hysterectomy - unknown type       Current Outpatient Prescriptions  Medication Sig Dispense Refill  . B Complex-C (B-COMPLEX WITH VITAMIN C) tablet Take 1 tablet by mouth daily.      . calcium carbonate (OS-CAL) 600 MG TABS Take 600 mg by mouth 2 (two) times daily with a meal.      . DIGOX 125 MCG tablet TAKE ONE TABLET EVERY DAY 90 tablet 3  . metoprolol (LOPRESSOR) 50 MG tablet TAKE ONE TABLET BY MOUTH TWICE DAILY 180 tablet 3  . warfarin (COUMADIN) 2.5 MG tablet Take 2.5 mg by mouth. AS DIRECTED BY [facility]IN CLIN[facility]     No current facility-administered medications for this visit.     Allergies:   Review of patient's allergies indicates no known allergies.    Social History:  The patient  reports that she has never smoked. She has never used smokeless tobacco. She reports that she does not drink alcohol or use drugs.     ROS:  Please see the  history of present illness.     All other systems are reviewed and negative.    PHYSICAL EXAM: VS:  BP 128/64 (BP Location: Left Arm, Patient Position: Sitting, Cuff Size: Normal)   Pulse 72   Ht 5\' 9"  (1.753 m)   Wt 137 lb 8 oz (62.4 kg)   BMI 20.31 kg/m  , BMI Body mass index is 20.31 kg/m. GEN: Well nourished, well developed, in no acute distress HEENT: normal Neck: no JVD, carotid bruits, or masses Cardiac: Irregularly irregular; no murmurs, rubs, or gallops,no edema  Respiratory:  clear to auscultation bilaterally, normal work of breathing GI: soft, nontender, nondistended, + BS MS: no deformity or atrophy Skin: warm and dry, no rash Neuro:  Strength and sensation are intact Psych: euthymic mood, full affect   EKG:  EKG is not ordered today.    Recent Labs: No results found for requested labs within last 8760 hours.    Lipid Panel No results found for: CHOL, TRIG, HDL, CHOLHDL, VLDL, LDLCALC, LDLDIRECT    Wt Readings from Last 3 Encounters:  09/25/15 137 lb 8 oz (62.4 kg)  03/25/15 137 lb (62.1 kg)  06/11/14 136 lb (61.7 kg)      Other studies Reviewed:   ASSESSMENT AND PLAN:  1.  Chronic atrial fibrillation: She  is doing extremely well with rate control. She is currently on metoprolol 50 mg twice daily and small dose digoxin with optimal level. She is tolerating long-term anticoagulation with warfarin. I might consider stopping digoxin in the near future.   Disposition:   FU with me in 6 months  Signed,  Kathlyn Sacramento, MD  09/25/2015 11:26 AM    [place]

## 2015-09-25 NOTE — Patient Instructions (Signed)
Medication Instructions: Continue same medications.   Labwork: None.   Procedures/Testing: None.   Follow-Up: 6 months with Dr. Campbell Agramonte.   Any Additional Special Instructions Will Be Listed Below (If Applicable).     If you need a refill on your cardiac medications before your next appointment, please call your pharmacy.   

## 2015-10-29 ENCOUNTER — Telehealth: Payer: Self-pay | Admitting: Cardiovascular Disease

## 2015-10-29 NOTE — Telephone Encounter (Signed)
Per MD Sept 2016 notes: "1.  Chronic atrial fibrillation: She is doing extremely well with rate control. She is currently on metoprolol 50 mg twice daily and small dose digoxin with optimal level. She is tolerating long-term anticoagulation with warfarin. I might consider stopping digoxin in the near future."  Informed pt Dr. Fletcher Anon has not stopped digoxin at this time. She is agreeable to continue having it refilled until her next f/u appt. Pt verbalized understanding with no further questions at this time.

## 2015-10-29 NOTE — Telephone Encounter (Signed)
Pt is calling stating last time she was seen we mentioned to her that she may not need to be on the Digixon   It was time for her to refill this but wants to know if she needs to or not Please advise

## 2016-01-12 ENCOUNTER — Telehealth: Payer: Self-pay | Admitting: Cardiovascular Disease

## 2016-01-12 NOTE — Telephone Encounter (Signed)
Pharmacy calling  The digixon 125 that we prescribed to patient is on back order  Would like to know what can they do in the mean time They are not sure when it would be in  Please call back and let them know

## 2016-01-12 NOTE — Telephone Encounter (Signed)
Please review and advise. Thanks.  

## 2016-01-13 NOTE — Telephone Encounter (Signed)
Pt states she has 15 digoxin tablets left. Notified Nunzio Cory at [facility] who will continue to try daily to reorder digoxin and will notify us in one week if it is still on back order.

## 2016-04-16 ENCOUNTER — Other Ambulatory Visit: Payer: Self-pay | Admitting: Cardiovascular Disease

## 2016-05-10 ENCOUNTER — Telehealth: Payer: Self-pay | Admitting: Cardiovascular Disease

## 2016-05-10 ENCOUNTER — Other Ambulatory Visit: Payer: Self-pay

## 2016-05-10 MED ORDER — METOPROLOL TARTRATE 50 MG PO TABS: 50.0000 mg | ORAL_TABLET | Freq: Two times a day (BID) | ORAL | 0 refills | Status: DC

## 2016-05-10 NOTE — Telephone Encounter (Signed)
°*  STAT* If patient is at the pharmacy, call can be transferred to refill team.   1. Which medications need to be refilled? (please list name of each medication and dose if known)  Metoprolol   2. Which pharmacy/location (including street and city if local pharmacy) is medication to be sent to? [facility] in [place]   3. Do they need a 30 day or 90 day supply? 90 day

## 2016-06-08 ENCOUNTER — Telehealth: Payer: Self-pay | Admitting: Cardiovascular Disease

## 2016-06-08 NOTE — Telephone Encounter (Signed)
Received fax from Laverna Peace, NP @ [facility] in [place]. Serum Digoxin 1.1 on May 30.  Pt advised by Amy to reduce digoxin to 140mcg on Mon, Wed, and Friday only.  Fax in Dr. Tyrell Antonio basket to review and further advise.

## 2016-06-14 NOTE — Telephone Encounter (Signed)
Continue same dose advised by Amy . Repeat Digoxin level in 1-2 weeks.

## 2016-06-14 NOTE — Telephone Encounter (Signed)
Routed to Dr. Fletcher Anon

## 2016-06-15 NOTE — Telephone Encounter (Signed)
S/w [name] [name], [name] [name]. She understands that Dr. Fletcher Anon is agreeable to change digoxin dose to MWF. Pt in her office at this time for repeat BMET. Will fax results to our office.

## 2016-06-15 NOTE — Telephone Encounter (Signed)
Left message with Cathie Beams at [facility] requesting call back from [place], regarding pt's digoxin level.

## 2016-06-17 ENCOUNTER — Telehealth: Payer: Self-pay | Admitting: Cardiovascular Disease

## 2016-06-17 NOTE — Telephone Encounter (Signed)
Digoxin lab faxed to Dr. Fletcher Anon from [facility]. Pt's dig level 0.4 on 6/13.  Laverna Peace, NP, recommends continuing digoxin 125mg  MWF. Reviewed by Dr. Fletcher Anon who is agreeable with plan.

## 2016-07-01 ENCOUNTER — Other Ambulatory Visit: Payer: Self-pay | Admitting: Cardiovascular Disease

## 2016-07-15 ENCOUNTER — Ambulatory Visit (INDEPENDENT_AMBULATORY_CARE_PROVIDER_SITE_OTHER): Payer: Medicare Other | Admitting: Cardiovascular Disease

## 2016-07-15 ENCOUNTER — Encounter: Payer: Self-pay | Admitting: Cardiovascular Disease

## 2016-07-15 VITALS — BP 150/86 | HR 66 | Ht 69.0 in | Wt 136.5 lb

## 2016-07-15 DIAGNOSIS — I482 Chronic atrial fibrillation, unspecified: Secondary | ICD-10-CM

## 2016-07-15 NOTE — Progress Notes (Signed)
Cardiology Office Note   Date:  07/15/2016   ID:  [name] [name], DOB 06/24/1915, MRN [id number]  PCP:  Lowella Dandy, NP  Cardiologist:   Kathlyn Sacramento, MD   Chief Complaint  Patient presents with  . other    6 month f/u pt would like to discuss digoxin due to elevated level. Meds reviewed verbally with pt.      History of Present Illness: [name] [name] is a 83 y.o. female who presents for a followup visit regarding chronic atrial fibrillation treated with rate control and warfarin anticoagulation. She has been doing very well. She denies any dyspnea, chest pain or palpitations. She is very active and able to perform all activities of daily living.  She lives in an assisted living facility.   She continues to take warfarin for anticoagulation with no side effects.   She was taking digoxin 0.125 mg once daily. Digoxin level came back at 1.1 and thus the dose was decreased to 0.125 mg 3 times weekly. Repeat level was 0.4.  Past Medical History:  Diagnosis Date  . Chronic atrial fibrillation (HCC)    used to be on Sotalol in past    Past Surgical History:  Procedure Laterality Date  . hysterectomy - unknown type       Current Outpatient Prescriptions  Medication Sig Dispense Refill  . DIGOX 125 MCG tablet TAKE ONE TABLET EVERY DAY 90 tablet 3  . levothyroxine (SYNTHROID, LEVOTHROID) 25 MCG tablet Take 25 mcg by mouth daily before breakfast.    . metoprolol tartrate (LOPRESSOR) 50 MG tablet Take 1 tablet (50 mg total) by mouth 2 (two) times daily. 90 tablet 0  . warfarin (COUMADIN) 2.5 MG tablet Take 2.5 mg by mouth. AS DIRECTED BY COUMADIN CLINIC     No current facility-administered medications for this visit.     Allergies:   Patient has no known allergies.    Social History:  The patient  reports that she has never smoked. She has never used smokeless tobacco. She reports that she does not drink alcohol or use drugs.     ROS:  Please see the history of  present illness.     All other systems are reviewed and negative.    PHYSICAL EXAM: VS:  BP (!) 150/86 (BP Location: Right Arm, Patient Position: Sitting, Cuff Size: Normal)   Pulse 66   Ht 5\' 9"  (1.753 m)   Wt 136 lb 8 oz (61.9 kg)   BMI 20.16 kg/m  , BMI Body mass index is 20.16 kg/m. GEN: Well nourished, well developed, in no acute distress  HEENT: normal  Neck: no JVD, carotid bruits, or masses Cardiac: Irregularly irregular; no murmurs, rubs, or gallops,no edema  Respiratory:  clear to auscultation bilaterally, normal work of breathing GI: soft, nontender, nondistended, + BS MS: no deformity or atrophy  Skin: warm and dry, no rash Neuro:  Strength and sensation are intact Psych: euthymic mood, full affect   EKG:  EKG is  ordered today.  The EKG showed atrial fibrillation with nonspecific ST or T wave changes.   Recent Labs: No results found for requested labs within last 8760 hours.    Lipid Panel No results found for: CHOL, TRIG, HDL, CHOLHDL, VLDL, LDLCALC, LDLDIRECT    Wt Readings from Last 3 Encounters:  07/15/16 136 lb 8 oz (61.9 kg)  09/25/15 137 lb 8 oz (62.4 kg)  03/25/15 137 lb (62.1 kg)      Other studies Reviewed:  ASSESSMENT AND PLAN:  1.  Chronic atrial fibrillation: She is doing extremely well with rate control. I elected to discontinue digoxin in order to minimize the risk of long-term toxicity. Continue metoprolol 50 mg twice daily for rate control. If ventricular rate increases after stopping digoxin, the dose of metoprolol can be increased to 75 mg twice daily. Continue anticoagulation with warfarin.  Disposition:   FU with me in 6 months  Signed,  Kathlyn Sacramento, MD  07/15/2016 12:07 PM    [place]

## 2016-07-15 NOTE — Patient Instructions (Signed)
Medication Instructions:  Your physician has recommended you make the following change in your medication:  STOP taking digoxin   Labwork: none  Testing/Procedures: none  Follow-Up: Your physician wants you to follow-up in: 6 months with Dr. Fletcher Anon.  You will receive a reminder letter in the mail two months in advance. If you don't receive a letter, please call our office to schedule the follow-up appointment.   Any Other Special Instructions Will Be Listed Below (If Applicable).     If you need a refill on your cardiac medications before your next appointment, please call your pharmacy.

## 2016-07-26 ENCOUNTER — Inpatient Hospital Stay (HOSPITAL_COMMUNITY): Payer: Medicare Other

## 2016-07-26 ENCOUNTER — Emergency Department (HOSPITAL_COMMUNITY): Payer: Medicare Other

## 2016-07-26 ENCOUNTER — Encounter (HOSPITAL_COMMUNITY): Payer: Self-pay

## 2016-07-26 ENCOUNTER — Inpatient Hospital Stay (HOSPITAL_COMMUNITY)
Admission: EM | Admit: 2016-07-26 | Discharge: 2016-07-28 | DRG: 064 | Disposition: A | Discharge status: Hospice | Payer: Medicare Other | Attending: Neurology | Admitting: Neurology

## 2016-07-26 DIAGNOSIS — Z7901 Long term (current) use of anticoagulants: Secondary | ICD-10-CM | POA: Diagnosis not present

## 2016-07-26 DIAGNOSIS — G8194 Hemiplegia, unspecified affecting left nondominant side: Secondary | ICD-10-CM | POA: Diagnosis present

## 2016-07-26 DIAGNOSIS — I610 Nontraumatic intracerebral hemorrhage in hemisphere, subcortical: Secondary | ICD-10-CM | POA: Diagnosis not present

## 2016-07-26 DIAGNOSIS — R791 Abnormal coagulation profile: Secondary | ICD-10-CM | POA: Diagnosis present

## 2016-07-26 DIAGNOSIS — E785 Hyperlipidemia, unspecified: Secondary | ICD-10-CM | POA: Diagnosis present

## 2016-07-26 DIAGNOSIS — C799 Secondary malignant neoplasm of unspecified site: Secondary | ICD-10-CM

## 2016-07-26 DIAGNOSIS — I10 Essential (primary) hypertension: Secondary | ICD-10-CM | POA: Diagnosis present

## 2016-07-26 DIAGNOSIS — J690 Pneumonitis due to inhalation of food and vomit: Secondary | ICD-10-CM | POA: Diagnosis present

## 2016-07-26 DIAGNOSIS — R29717 NIHSS score 17: Secondary | ICD-10-CM | POA: Diagnosis present

## 2016-07-26 DIAGNOSIS — G936 Cerebral edema: Secondary | ICD-10-CM | POA: Diagnosis present

## 2016-07-26 DIAGNOSIS — I482 Chronic atrial fibrillation: Secondary | ICD-10-CM | POA: Diagnosis present

## 2016-07-26 DIAGNOSIS — R414 Neurologic neglect syndrome: Secondary | ICD-10-CM | POA: Diagnosis present

## 2016-07-26 DIAGNOSIS — Z515 Encounter for palliative care: Secondary | ICD-10-CM | POA: Diagnosis not present

## 2016-07-26 DIAGNOSIS — R1310 Dysphagia, unspecified: Secondary | ICD-10-CM | POA: Diagnosis present

## 2016-07-26 DIAGNOSIS — D696 Thrombocytopenia, unspecified: Secondary | ICD-10-CM | POA: Diagnosis present

## 2016-07-26 DIAGNOSIS — G935 Compression of brain: Secondary | ICD-10-CM | POA: Diagnosis present

## 2016-07-26 DIAGNOSIS — I619 Nontraumatic intracerebral hemorrhage, unspecified: Secondary | ICD-10-CM | POA: Diagnosis present

## 2016-07-26 DIAGNOSIS — I629 Nontraumatic intracranial hemorrhage, unspecified: Secondary | ICD-10-CM

## 2016-07-26 DIAGNOSIS — Z66 Do not resuscitate: Secondary | ICD-10-CM | POA: Diagnosis not present

## 2016-07-26 DIAGNOSIS — H510 Palsy (spasm) of conjugate gaze: Secondary | ICD-10-CM | POA: Diagnosis present

## 2016-07-26 DIAGNOSIS — R471 Dysarthria and anarthria: Secondary | ICD-10-CM | POA: Diagnosis present

## 2016-07-26 HISTORY — DX: Disorder of thyroid, unspecified: E07.9

## 2016-07-26 HISTORY — DX: Essential (primary) hypertension: I10

## 2016-07-26 LAB — DIFFERENTIAL
BASOS ABS: 0 10*3/uL (ref 0.0–0.1)
Basophils Relative: 0 %
EOS ABS: 0 10*3/uL (ref 0.0–0.7)
Eosinophils Relative: 0 %
LYMPHS ABS: 0.8 10*3/uL (ref 0.7–4.0)
LYMPHS PCT: 10 %
MONOS PCT: 2 %
Monocytes Absolute: 0.2 10*3/uL (ref 0.1–1.0)
Neutro Abs: 7.2 10*3/uL (ref 1.7–7.7)
Neutrophils Relative %: 88 %

## 2016-07-26 LAB — URINALYSIS, ROUTINE W REFLEX MICROSCOPIC
Bilirubin Urine: NEGATIVE
GLUCOSE, UA: NEGATIVE mg/dL
HGB URINE DIPSTICK: NEGATIVE
KETONES UR: 5 mg/dL — AB
LEUKOCYTES UA: NEGATIVE
Nitrite: NEGATIVE
PH: 6 (ref 5.0–8.0)
Protein, ur: NEGATIVE mg/dL
Specific Gravity, Urine: 1.016 (ref 1.005–1.030)

## 2016-07-26 LAB — APTT: APTT: 33 s (ref 24–36)

## 2016-07-26 LAB — I-STAT CHEM 8, ED
BUN: 24 mg/dL — AB (ref 6–20)
CALCIUM ION: 1.11 mmol/L — AB (ref 1.15–1.40)
CHLORIDE: 103 mmol/L (ref 101–111)
CREATININE: 0.8 mg/dL (ref 0.44–1.00)
GLUCOSE: 185 mg/dL — AB (ref 65–99)
HCT: 40 % (ref 36.0–46.0)
Hemoglobin: 13.6 g/dL (ref 12.0–15.0)
Potassium: 4.3 mmol/L (ref 3.5–5.1)
SODIUM: 141 mmol/L (ref 135–145)
TCO2: 26 mmol/L (ref 0–100)

## 2016-07-26 LAB — CBC
HEMATOCRIT: 39.6 % (ref 36.0–46.0)
HEMOGLOBIN: 12.8 g/dL (ref 12.0–15.0)
MCH: 30.3 pg (ref 26.0–34.0)
MCHC: 32.3 g/dL (ref 30.0–36.0)
MCV: 93.8 fL (ref 78.0–100.0)
Platelets: 130 10*3/uL — ABNORMAL LOW (ref 150–400)
RBC: 4.22 MIL/uL (ref 3.87–5.11)
RDW: 14.4 % (ref 11.5–15.5)
WBC: 8.2 10*3/uL (ref 4.0–10.5)

## 2016-07-26 LAB — PROTIME-INR
INR: 1.58
INR: 1.12
Prothrombin Time: 19 seconds — ABNORMAL HIGH (ref 11.4–15.2)
Prothrombin Time: 14.4 seconds (ref 11.4–15.2)

## 2016-07-26 LAB — COMPREHENSIVE METABOLIC PANEL
ALK PHOS: 62 U/L (ref 38–126)
ALT: 16 U/L (ref 14–54)
AST: 28 U/L (ref 15–41)
Albumin: 3.9 g/dL (ref 3.5–5.0)
Anion gap: 11 (ref 5–15)
BILIRUBIN TOTAL: 0.9 mg/dL (ref 0.3–1.2)
BUN: 18 mg/dL (ref 6–20)
CALCIUM: 9.3 mg/dL (ref 8.9–10.3)
CO2: 25 mmol/L (ref 22–32)
CREATININE: 0.99 mg/dL (ref 0.44–1.00)
Chloride: 104 mmol/L (ref 101–111)
GFR calc Af Amer: 53 mL/min — ABNORMAL LOW (ref >60)
GFR, EST NON AFRICAN AMERICAN: 46 mL/min — AB (ref >60)
GLUCOSE: 190 mg/dL — AB (ref 65–99)
POTASSIUM: 4.3 mmol/L (ref 3.5–5.1)
Sodium: 140 mmol/L (ref 135–145)
TOTAL PROTEIN: 6.9 g/dL (ref 6.5–8.1)

## 2016-07-26 LAB — CBG MONITORING, ED: Glucose-Capillary: 167 mg/dL — ABNORMAL HIGH (ref 65–99)

## 2016-07-26 LAB — I-STAT TROPONIN, ED: TROPONIN I, POC: 0.01 ng/mL (ref 0.00–0.08)

## 2016-07-26 LAB — MRSA PCR SCREENING: MRSA BY PCR: NEGATIVE

## 2016-07-26 MED ORDER — MORPHINE SULFATE (PF) 4 MG/ML IV SOLN
2.0000 mg | Freq: Once | INTRAVENOUS | Status: AC
  Administered 2016-07-26: 2 mg via INTRAVENOUS
  Filled 2016-07-26: qty 1

## 2016-07-26 MED ORDER — ACETAMINOPHEN 325 MG PO TABS: 650.0000 mg | ORAL_TABLET | ORAL | Status: DC | PRN

## 2016-07-26 MED ORDER — IOPAMIDOL (ISOVUE-370) INJECTION 76%
INTRAVENOUS | Status: AC
  Administered 2016-07-26: 50 mL
  Filled 2016-07-26: qty 50

## 2016-07-26 MED ORDER — ONDANSETRON HCL 4 MG/2ML IJ SOLN
4.0000 mg | Freq: Once | INTRAMUSCULAR | Status: AC
  Administered 2016-07-26: 4 mg via INTRAVENOUS

## 2016-07-26 MED ORDER — LEVOTHYROXINE SODIUM 25 MCG PO TABS
25.0000 ug | ORAL_TABLET | Freq: Every day | ORAL | Status: DC
Start: 2016-07-27 — End: 2016-07-28

## 2016-07-26 MED ORDER — PROTHROMBIN COMPLEX CONC HUMAN 500 UNITS IV KIT
1621.0000 [IU] | PACK | Status: AC
  Administered 2016-07-26: 1621 [IU] via INTRAVENOUS
  Filled 2016-07-26: qty 44.84

## 2016-07-26 MED ORDER — LABETALOL HCL 5 MG/ML IV SOLN
20.0000 mg | Freq: Once | INTRAVENOUS | Status: AC
  Administered 2016-07-26: 20 mg via INTRAVENOUS
  Filled 2016-07-26: qty 4

## 2016-07-26 MED ORDER — SENNOSIDES-DOCUSATE SODIUM 8.6-50 MG PO TABS
1.0000 | ORAL_TABLET | Freq: Two times a day (BID) | ORAL | Status: DC
  Filled 2016-07-26: qty 1

## 2016-07-26 MED ORDER — VITAMIN K1 10 MG/ML IJ SOLN
10.0000 mg | INTRAVENOUS | Status: AC
  Administered 2016-07-26: 10 mg via INTRAVENOUS
  Filled 2016-07-26: qty 1

## 2016-07-26 MED ORDER — ONDANSETRON HCL 4 MG/2ML IJ SOLN
INTRAMUSCULAR | Status: AC
  Filled 2016-07-26: qty 2

## 2016-07-26 MED ORDER — METOPROLOL TARTRATE 50 MG PO TABS: 50.0000 mg | ORAL_TABLET | Freq: Two times a day (BID) | ORAL | Status: DC

## 2016-07-26 MED ORDER — SODIUM CHLORIDE 0.9 % IV SOLN
INTRAVENOUS | Status: DC
  Administered 2016-07-26 – 2016-07-28 (×3): via INTRAVENOUS

## 2016-07-26 MED ORDER — NICARDIPINE HCL IN NACL 20-0.86 MG/200ML-% IV SOLN
0.0000 mg/h | INTRAVENOUS | Status: DC
  Administered 2016-07-26: 5 mg/h via INTRAVENOUS
  Filled 2016-07-26 (×2): qty 200

## 2016-07-26 MED ORDER — ACETAMINOPHEN 160 MG/5ML PO SOLN: 650.0000 mg | ORAL | Status: DC | PRN

## 2016-07-26 MED ORDER — PANTOPRAZOLE SODIUM 40 MG IV SOLR
40.0000 mg | Freq: Every day | INTRAVENOUS | Status: DC
  Administered 2016-07-26: 40 mg via INTRAVENOUS
  Filled 2016-07-26: qty 40

## 2016-07-26 MED ORDER — DEXAMETHASONE SODIUM PHOSPHATE 10 MG/ML IJ SOLN
10.0000 mg | Freq: Once | INTRAMUSCULAR | Status: AC
  Administered 2016-07-26: 10 mg via INTRAVENOUS
  Filled 2016-07-26: qty 1

## 2016-07-26 MED ORDER — ACETAMINOPHEN 650 MG RE SUPP
650.0000 mg | RECTAL | Status: DC | PRN
  Administered 2016-07-28: 650 mg via RECTAL
  Filled 2016-07-26: qty 1

## 2016-07-26 MED ORDER — ONDANSETRON HCL 4 MG/2ML IJ SOLN
4.0000 mg | Freq: Four times a day (QID) | INTRAMUSCULAR | Status: DC | PRN
  Administered 2016-07-27 – 2016-07-28 (×2): 4 mg via INTRAVENOUS
  Filled 2016-07-26 (×2): qty 2

## 2016-07-26 MED ORDER — GADOBENATE DIMEGLUMINE 529 MG/ML IV SOLN
10.0000 mL | Freq: Once | INTRAVENOUS | Status: AC | PRN
  Administered 2016-07-26: 10 mL via INTRAVENOUS

## 2016-07-26 MED ORDER — SODIUM CHLORIDE 0.9 % IV BOLUS (SEPSIS)
500.0000 mL | Freq: Once | INTRAVENOUS | Status: AC
  Administered 2016-07-26: 500 mL via INTRAVENOUS

## 2016-07-26 MED ORDER — STROKE: EARLY STAGES OF RECOVERY BOOK
Freq: Once | Status: DC
  Filled 2016-07-26: qty 1

## 2016-07-26 NOTE — ED Notes (Signed)
This rn called pharmacy to send Kcentra and vitamin K

## 2016-07-26 NOTE — ED Provider Notes (Signed)
[place] DEPT Provider Note   CSN: [id number] Arrival date & time: 07/26/16  1140   An emergency department physician performed an initial assessment on this suspected stroke patient at 1154.  History   Chief Complaint Chief Complaint  Patient presents with  . Dysarthria    LSN 8pm  . Weakness   Level V caveat: Dysarthria/altered mental status  HPI [name] [name] is a 83 y.o. female.  HPI Patient is a functional 83 year old who lives at an independent living facility who was normal last night and went to a wedding in [place].  She was dropped off at her house by a family member and was found with left-sided hemiparesis and altered mental status this morning.  She normally arises at approximate 7 AM but no one saw her breakfast and therefore a friend when checked on her and found her in her room at approximately 9 AM.  She is on Coumadin for history of atrial fibrillation.  She has hypertension and hypothyroidism for which she is on Synthroid.  No recent known injury or fall.  Family reports she was normal yesterday.  She was last seen around 8 PM last night   Past Medical History:  Diagnosis Date  . Chronic atrial fibrillation (HCC)    used to be on Sotalol in past  . Hypertension   . Thyroid disease     Patient Active Problem List   Diagnosis Date Noted  . Falls 09/19/2012  . Edema of both legs 02/23/2012  . Chronic atrial fibrillation [facility])     Past Surgical History:  Procedure Laterality Date  . hysterectomy - unknown type      OB History    No data available       Home Medications    Prior to Admission medications   Medication Sig Start Date End Date Taking? Authorizing Provider  levothyroxine (SYNTHROID, LEVOTHROID) 25 MCG tablet Take 25 mcg by mouth daily before breakfast.    [provider]  metoprolol tartrate (LOPRESSOR) 50 MG tablet Take 1 tablet (50 mg total) by mouth 2 (two) times daily. 07/01/16   Wellington Hampshire, MD  warfarin  (COUMADIN) 2.5 MG tablet Take 2.5 mg by mouth. AS DIRECTED BY COUMADIN CLINIC    [provider]    Family History Family History  Problem Relation Age of Onset  . Heart failure Unknown   . Dementia Unknown     Social History Social History  Substance Use Topics  . Smoking status: Never Smoker  . Smokeless tobacco: Never Used  . Alcohol use No     Allergies   Patient has no known allergies.   Review of Systems Review of Systems  Unable to perform ROS: Mental status change     Physical Exam Updated Vital Signs BP 117/68   Pulse 75   Temp (!) 97.4 F (36.3 C) (Oral)   Resp 13   SpO2 97%   Physical Exam  Constitutional: She appears well-developed and well-nourished.  HENT:  Head: Normocephalic and atraumatic.  Eyes: EOM are normal.  Neck: Normal range of motion.  Cardiovascular: Normal rate, regular rhythm and normal heart sounds.   Pulmonary/Chest: Effort normal and breath sounds normal.  Abdominal: Soft. She exhibits no distension. There is no tenderness.  Musculoskeletal: Normal range of motion.  Neurological: She is alert.  Left-sided hemi-neglect.  Left-sided hemiparesis.  Skin: Skin is warm and dry.  Psychiatric: She has a normal mood and affect. Judgment normal.  Nursing note and vitals  reviewed.    ED Treatments / Results  Labs (all labs ordered are listed, but only abnormal results are displayed) Labs Reviewed  PROTIME-INR - Abnormal; Notable for the following:       Result Value   Prothrombin Time 19.0 (*)    All other components within normal limits  CBC - Abnormal; Notable for the following:    Platelets 130 (*)    All other components within normal limits  COMPREHENSIVE METABOLIC PANEL - Abnormal; Notable for the following:    Glucose, Bld 190 (*)    GFR calc non Af Amer 46 (*)    GFR calc Af Amer 53 (*)    All other components within normal limits  CBG MONITORING, ED - Abnormal; Notable for the following:     Glucose-Capillary 167 (*)    All other components within normal limits  I-STAT CHEM 8, ED - Abnormal; Notable for the following:    BUN 24 (*)    Glucose, Bld 185 (*)    Calcium, Ion 1.11 (*)    All other components within normal limits  APTT  DIFFERENTIAL  URINALYSIS, ROUTINE W REFLEX MICROSCOPIC  PROTIME-INR  I-STAT TROPONIN, ED    EKG  EKG Interpretation  Date/Time:  Monday July 26 2016 11:48:53 EDT Ventricular Rate:  105 PR Interval:    QRS Duration: 86 QT Interval:  344 QTC Calculation: 457 R Axis:   79 Text Interpretation:  Atrial fibrillation Ventricular premature complex Borderline low voltage, extremity leads Anteroseptal infarct, old Repol abnrm, severe global ischemia (LM/MVD) No old tracing to compare Confirmed by Jola Schmidt [phone]) on 07/26/2016 2:01:02 PM       Radiology Ct Head Wo Contrast  Result Date: 07/26/2016 CLINICAL DATA:  83 year old hypertensive female with change in mental status and left-sided weakness. Chronic atrial fibrillation. Initial encounter. EXAM: CT HEAD WITHOUT CONTRAST TECHNIQUE: Contiguous axial images were obtained from the base of the skull through the vertex without intravenous contrast. COMPARISON:  None. FINDINGS: Brain: Right lenticular nucleus hemorrhage with a slightly bilobed appearance spanning over 3.7 x 2.1 x 2.6 cm with surrounding vasogenic edema and local mass effect with flattening of the right lateral ventricle without midline shift. The right middle cerebral artery appears dense. Additionally, the hypodensity within the right frontal-temporal lobe and right periatrial region appears more extensive than expected for lenticular nucleus hemorrhage and it may be that the patient had an acute infarct which subsequently blood. Underlying mass as a cause for the abnormality is felt to be less likely consideration and can be addressed as hemorrhage clears and infarct evolves. Vascular: Hyperdense right middle cerebral artery. Skull:  No skull fracture. Sinuses/Orbits: No acute orbital abnormality. Visualized paranasal sinuses are clear. Other: Partial opacification inferior right mastoid air cells. Degenerative changes C1-2 articulation. IMPRESSION: Right lenticular nucleus hemorrhage with a slightly bilobed appearance spanning over 3.7 x 2.1 x 2.6 cm with surrounding vasogenic edema and local mass effect with flattening of the right lateral ventricle without midline shift. The right middle cerebral artery appears dense. Additionally, the hypodensity within the right frontal-temporal lobe and right periatrial region appears more extensive than expected for lenticular nucleus hemorrhage and it may be that the patient had an acute infarct which subsequently blood. Underlying mass as a cause for the abnormality is felt to be less likely consideration and can be addressed as hemorrhage clears and infarct evolves. These results were called by telephone at the time of interpretation on 07/26/2016 at 12:28 pm to Dr. Jola Schmidt ,  who verbally acknowledged these results. Electronically Signed   By: Genia Del M.D.   On: 07/26/2016 12:42    Procedures .Critical Care Performed by: Jola Schmidt Authorized by: Jola Schmidt    Total critical care time: 35 minutes Critical care time was exclusive of separately billable procedures and treating other patients. Critical care was necessary to treat or prevent imminent or life-threatening deterioration. Critical care was time spent personally by me on the following activities: development of treatment plan with patient and/or surrogate as well as nursing, discussions with consultants, evaluation of patient's response to treatment, examination of patient, obtaining history from patient or surrogate, ordering and performing treatments and interventions, ordering and review of laboratory studies, ordering and review of radiographic studies, pulse oximetry and re-evaluation of patient's  condition.   Medications Ordered in ED Medications  prothrombin complex conc human (KCENTRA) IVPB 1,621 Units (1,621 Units Intravenous New Bag/Given 07/26/16 1305)  phytonadione (VITAMIN K) 10 mg in dextrose 5 % 50 mL IVPB (10 mg Intravenous New Bag/Given 07/26/16 1307)  dexamethasone (DECADRON) injection 10 mg (not administered)  iopamidol (ISOVUE-370) 76 % injection (50 mLs  Contrast Given 07/26/16 1214)  morphine 4 MG/ML injection 2 mg (2 mg Intravenous Given 07/26/16 1233)     Initial Impression / Assessment and Plan / ED Course  I have reviewed the triage vital signs and the nursing notes.  Pertinent labs & imaging results that were available during my care of the patient were reviewed by me and considered in my medical decision making (see chart for details).     Patient with acute intercranial bleed on Coumadin.  Given her functional status up until yesterday she was given aggressive medical management including K Cintron for rapid reversal of her INR.  Her prognosis still is overall poor given her advanced age and the severity of her bleed but we will be as aggressive as possible from a medical standpoint.  Family is very reasonable and understands the gravity of the situation.  Palliative care consultation will be obtained to help with goals of treatment.  She'll be admitted to the intensive care unit.  Appreciate the assistance of neurology who will admit this patient.    Final Clinical Impressions(s) / ED Diagnoses   Final diagnoses:  Intracranial bleed [facility])    New Prescriptions New Prescriptions   No medications on file     Jola Schmidt, MD 07/26/16 [phone]

## 2016-07-26 NOTE — Consult Note (Deleted)
Requesting Physician: Dr. Venora Maples    Chief Complaint: ICH and possible metastasis  History obtained from: Chart    HPI:                                                                                                                                         [name] [name] is an 83 y.o. female  who at baseline does very well well. Last night she was last seen normal at approximately around 8:00 but this is not certain. This morning she was found with a gaze deviation, left-sided weakness and difficulty speaking. Patient was brought to the emergency department where CT of head showed a right parietal lobe ICH with significant amount of edema surrounding this. It is unclear if this is a primary ICH or a tumor with hemorrhagic transformation. At present time she is awaiting an MRI with and without contrast of her brain. Patient does have chronic A. fib and she is on Coumadin with her recent INR today being 1.58.  Family does not know of any form of cancer being melanoma, lung, or breast in the past.  Date last known well: Date: 07/26/2016 Time last known well: Unable to determine tPA Given: No: ICH Modified Rankin: Rankin Score=0   Past Medical History:  Diagnosis Date  . Chronic atrial fibrillation (HCC)    used to be on Sotalol in past  . Hypertension   . Thyroid disease     Past Surgical History:  Procedure Laterality Date  . hysterectomy - unknown type      Family History  Problem Relation Age of Onset  . Heart failure Unknown   . Dementia Unknown    Social History:  reports that she has never smoked. She has never used smokeless tobacco. She reports that she does not drink alcohol or use drugs.  Allergies: No Known Allergies  Medications:                                                                                                                           , Current Facility-Administered Medications  Medication Dose Route Frequency Provider Last Rate Last Dose  .  dexamethasone (DECADRON) injection 10 mg  10 mg Intravenous Once Jola Schmidt, MD      . phytonadione (VITAMIN K) 10 mg in dextrose 5 % 50 mL IVPB  10 mg Intravenous STAT Jola Schmidt,  MD 150 mL/hr at 07/26/16 1307 10 mg at 07/26/16 1307  . prothrombin complex conc human (KCENTRA) IVPB 1,621 Units  1,621 Units Intravenous April Holding, MD 155.6 mL/hr at 07/26/16 1305 1,621 Units at 07/26/16 1305   Current Outpatient Prescriptions  Medication Sig Dispense Refill  . B Complex-C (SUPER B COMPLEX PO) Take 1 capsule by mouth daily.    . Calcium Carb-Cholecalciferol (CALCIUM 600/VITAMIN D3) 600-800 MG-UNIT TABS Take 1 tablet by mouth daily.    Marland Kitchen levothyroxine (SYNTHROID, LEVOTHROID) 25 MCG tablet Take 25 mcg by mouth daily before breakfast.    . metoprolol tartrate (LOPRESSOR) 50 MG tablet Take 1 tablet (50 mg total) by mouth 2 (two) times daily. 90 tablet 0  . warfarin (COUMADIN) 2.5 MG tablet Take 2.5-5 mg by mouth See admin instructions. 2.5mg  by mouth once daily except Saturdays - 5mg  on Saturdays       ROS:                                                                                                                                       History obtained from unobtainable from patient due to mental status    Neurologic Examination:                                                                                                      Blood pressure 117/68, pulse 75, temperature (!) 97.4 F (36.3 C), temperature source Oral, resp. rate 13, SpO2 97 %.  HEENT-  Normocephalic, no lesions, without obvious abnormality.  Normal external eye and conjunctiva.  Normal TM's bilaterally.  Normal auditory canals and external ears. Normal external nose, mucus membranes and septum.  Normal pharynx. Cardiovascular- S1, S2 normal, pulses palpable throughout   Lungs- chest clear, no wheezing, rales, normal symmetric air entry Abdomen- normal findings: bowel sounds normal Extremities- no edema Lymph-no  adenopathy palpable Musculoskeletal-no joint tenderness, deformity or swelling Skin-warm and dry, no hyperpigmentation, vitiligo, or suspicious lesions  Neurological Examination Mental Status: Alert, oriented, thought content appropriate.  Speech fluent without evidence of aphasia.  Able to follow 3 step commands without difficulty. Cranial Nerves: II: Discs flat bilaterally; Visual fields grossly normal,  III,IV, VI: ptosis not present, extra-ocular motions intact bilaterally, pupils equal, round, reactive to light and accommodation V,VII: smile symmetric, facial light touch sensation normal bilaterally VIII: hearing normal bilaterally IX,X: uvula rises symmetrically XI: bilateral shoulder shrug XII: midline tongue extension Motor: Right : Upper extremity   5/5  Left:     Upper extremity   5/5  Lower extremity   5/5     Lower extremity   5/5 Tone and bulk:normal tone throughout; no atrophy noted Sensory: Pinprick and light touch intact throughout, bilaterally Deep Tendon Reflexes: 2+ and symmetric throughout Plantars: Right: downgoing   Left: downgoing Cerebellar: normal finger-to-nose, normal rapid alternating movements and normal heel-to-shin test Gait: normal gait and station       Lab Results: Basic Metabolic Panel:  Recent Labs Lab 07/26/16 1144 07/26/16 1156  NA 140 141  K 4.3 4.3  CL 104 103  CO2 25  --   GLUCOSE 190* 185*  BUN 18 24*  CREATININE 0.99 0.80  CALCIUM 9.3  --     Liver Function Tests:  Recent Labs Lab 07/26/16 1144  AST 28  ALT 16  ALKPHOS 62  BILITOT 0.9  PROT 6.9  ALBUMIN 3.9   No results for input(s): LIPASE, AMYLASE in the last 168 hours. No results for input(s): AMMONIA in the last 168 hours.  CBC:  Recent Labs Lab 07/26/16 1144 07/26/16 1156  WBC 8.2  --   NEUTROABS 7.2  --   HGB 12.8 13.6  HCT 39.6 40.0  MCV 93.8  --   PLT 130*  --     Cardiac Enzymes: No results for input(s): CKTOTAL, CKMB, CKMBINDEX,  TROPONINI in the last 168 hours.  Lipid Panel: No results for input(s): CHOL, TRIG, HDL, CHOLHDL, VLDL, LDLCALC in the last 168 hours.  CBG:  Recent Labs Lab 07/26/16 [place]    Microbiology: No results found for this or any previous visit.  Coagulation Studies:  Recent Labs  07/26/16 1144  LABPROT 19.0*  INR 1.58    Imaging: Ct Head Wo Contrast  Result Date: 07/26/2016 CLINICAL DATA:  83 year old hypertensive female with change in mental status and left-sided weakness. Chronic atrial fibrillation. Initial encounter. EXAM: CT HEAD WITHOUT CONTRAST TECHNIQUE: Contiguous axial images were obtained from the base of the skull through the vertex without intravenous contrast. COMPARISON:  None. FINDINGS: Brain: Right lenticular nucleus hemorrhage with a slightly bilobed appearance spanning over 3.7 x 2.1 x 2.6 cm with surrounding vasogenic edema and local mass effect with flattening of the right lateral ventricle without midline shift. The right middle cerebral artery appears dense. Additionally, the hypodensity within the right frontal-temporal lobe and right periatrial region appears more extensive than expected for lenticular nucleus hemorrhage and it may be that the patient had an acute infarct which subsequently blood. Underlying mass as a cause for the abnormality is felt to be less likely consideration and can be addressed as hemorrhage clears and infarct evolves. Vascular: Hyperdense right middle cerebral artery. Skull: No skull fracture. Sinuses/Orbits: No acute orbital abnormality. Visualized paranasal sinuses are clear. Other: Partial opacification inferior right mastoid air cells. Degenerative changes C1-2 articulation. IMPRESSION: Right lenticular nucleus hemorrhage with a slightly bilobed appearance spanning over 3.7 x 2.1 x 2.6 cm with surrounding vasogenic edema and local mass effect with flattening of the right lateral ventricle without midline shift. The right middle  cerebral artery appears dense. Additionally, the hypodensity within the right frontal-temporal lobe and right periatrial region appears more extensive than expected for lenticular nucleus hemorrhage and it may be that the patient had an acute infarct which subsequently blood. Underlying mass as a cause for the abnormality is felt to be less likely consideration and can be addressed as hemorrhage clears and infarct evolves. These results were called by telephone  at the time of interpretation on 07/26/2016 at 12:28 pm to Dr. Jola Schmidt , who verbally acknowledged these results. Electronically Signed   By: Genia Del M.D.   On: 07/26/2016 12:42       Assessment and plan discussed with with attending physician and they are in agreement.    Etta Quill PA-C [facility] [phone]  07/26/2016, 1:21 PM   Assessment: 83 y.o. female presented to the emergency department with altered mental status, left-sided weakness, and CT results showing a right temporal ICH with significant amount of edema. At this time it is unclear if there is underlying mass with hemorrhagic transformation of this is a primary ICH.  Stroke Risk Factors - atrial fibrillation and hypertension

## 2016-07-26 NOTE — ED Notes (Signed)
Lab results given to [facility].

## 2016-07-26 NOTE — H&P (Signed)
H&P    Chief Complaint: ICH and possible metastasis  History obtained from: Chart    HPI:                                                                                                                                         Darcie Mellone is an 83 y.o. female  who at baseline does very well well. Last night she was last seen normal at approximately around 8:00 but this is not certain. This morning she was found with a gaze deviation, left-sided weakness and difficulty speaking. Patient was brought to the emergency department where CT of head showed a right parietal lobe ICH with significant amount of edema surrounding this. It is unclear if this is a primary ICH or a tumor with hemorrhagic transformation. At present time she is awaiting an MRI with and without contrast of her brain. Patient does have chronic A. fib and she is on Coumadin with her recent INR today being 1.58. She was reversed in the ER with K-centra and Vit K.  Family does not know of any form of cancer being melanoma, lung, or breast in the past.  Date last known well: Date: 07/26/2016 Time last known well: Unable to determine tPA Given: No: ICH Modified Rankin: Rankin Score=0   Past Medical History:  Diagnosis Date  . Chronic atrial fibrillation (HCC)    used to be on Sotalol in past  . Hypertension   . Thyroid disease     Past Surgical History:  Procedure Laterality Date  . hysterectomy - unknown type      Family History  Problem Relation Age of Onset  . Heart failure Unknown   . Dementia Unknown    Social History:  reports that she has never smoked. She has never used smokeless tobacco. She reports that she does not drink alcohol or use drugs.  Allergies: No Known Allergies  Medications:                                                                                                                           , Current Facility-Administered Medications  Medication Dose Route Frequency Provider Last Rate  Last Dose  . dexamethasone (DECADRON) injection 10 mg  10 mg Intravenous Once Jola Schmidt, MD      . phytonadione (VITAMIN K) 10 mg in dextrose 5 % 50 mL  IVPB  10 mg Intravenous April Holding, MD 150 mL/hr at 07/26/16 1307 10 mg at 07/26/16 1307  . prothrombin complex conc human (KCENTRA) IVPB 1,621 Units  1,621 Units Intravenous April Holding, MD 155.6 mL/hr at 07/26/16 1305 1,621 Units at 07/26/16 1305   Current Outpatient Prescriptions  Medication Sig Dispense Refill  . B Complex-C (SUPER B COMPLEX PO) Take 1 capsule by mouth daily.    . Calcium Carb-Cholecalciferol (CALCIUM 600/VITAMIN D3) 600-800 MG-UNIT TABS Take 1 tablet by mouth daily.    Marland Kitchen levothyroxine (SYNTHROID, LEVOTHROID) 25 MCG tablet Take 25 mcg by mouth daily before breakfast.    . metoprolol tartrate (LOPRESSOR) 50 MG tablet Take 1 tablet (50 mg total) by mouth 2 (two) times daily. 90 tablet 0  . warfarin (COUMADIN) 2.5 MG tablet Take 2.5-5 mg by mouth See admin instructions. 2.77m by mouth once daily except Saturdays - 575mon Saturdays       ROS:                                                                                                                                       History obtained from unobtainable from patient due to mental status    Neurologic Examination:                                                                                                      Blood pressure 117/68, pulse 75, temperature (!) 97.4 F (36.3 C), temperature source Oral, resp. rate 13, SpO2 97 %.  HEENT-  Normocephalic, no lesions, without obvious abnormality.  Normal external eye and conjunctiva.  Normal TM's bilaterally.  Normal auditory canals and external ears. Normal external nose, mucus membranes and septum.  Normal pharynx. Cardiovascular- S1, S2 normal, pulses palpable throughout   Lungs- chest clear, no wheezing, rales, normal symmetric air entry Abdomen- normal findings: bowel sounds normal Extremities- no  edema Lymph-no adenopathy palpable Musculoskeletal-no joint tenderness, deformity or swelling Skin-warm and dry, no hyperpigmentation, vitiligo, or suspicious lesions  Neurological Examination See attending addendum    Lab Results: Basic Metabolic Panel:  Recent Labs Lab 07/26/16 1144 07/26/16 1156  NA 140 141  K 4.3 4.3  CL 104 103  CO2 25  --   GLUCOSE 190* 185*  BUN 18 24*  CREATININE 0.99 0.80  CALCIUM 9.3  --     Liver Function Tests:  Recent Labs Lab 07/26/16 1144  AST 28  ALT 16  ALKPHOS 62  BILITOT 0.9  PROT 6.9  ALBUMIN 3.9   No results for input(s): LIPASE, AMYLASE in the last 168 hours. No results for input(s): AMMONIA in the last 168 hours.  CBC:  Recent Labs Lab 07/26/16 1144 07/26/16 1156  WBC 8.2  --   NEUTROABS 7.2  --   HGB 12.8 13.6  HCT 39.6 40.0  MCV 93.8  --   PLT 130*  --     Cardiac Enzymes: No results for input(s): CKTOTAL, CKMB, CKMBINDEX, TROPONINI in the last 168 hours.  Lipid Panel: No results for input(s): CHOL, TRIG, HDL, CHOLHDL, VLDL, LDLCALC in the last 168 hours.  CBG:  Recent Labs Lab 07/26/16 [place]    Microbiology: No results found for this or any previous visit.  Coagulation Studies:  Recent Labs  07/26/16 1144  LABPROT 19.0*  INR 1.58    Imaging: Ct Head Wo Contrast  Result Date: 07/26/2016 CLINICAL DATA:  83 year old hypertensive female with change in mental status and left-sided weakness. Chronic atrial fibrillation. Initial encounter. EXAM: CT HEAD WITHOUT CONTRAST TECHNIQUE: Contiguous axial images were obtained from the base of the skull through the vertex without intravenous contrast. COMPARISON:  None. FINDINGS: Brain: Right lenticular nucleus hemorrhage with a slightly bilobed appearance spanning over 3.7 x 2.1 x 2.6 cm with surrounding vasogenic edema and local mass effect with flattening of the right lateral ventricle without midline shift. The right middle cerebral artery  appears dense. Additionally, the hypodensity within the right frontal-temporal lobe and right periatrial region appears more extensive than expected for lenticular nucleus hemorrhage and it may be that the patient had an acute infarct which subsequently blood. Underlying mass as a cause for the abnormality is felt to be less likely consideration and can be addressed as hemorrhage clears and infarct evolves. Vascular: Hyperdense right middle cerebral artery. Skull: No skull fracture. Sinuses/Orbits: No acute orbital abnormality. Visualized paranasal sinuses are clear. Other: Partial opacification inferior right mastoid air cells. Degenerative changes C1-2 articulation. IMPRESSION: Right lenticular nucleus hemorrhage with a slightly bilobed appearance spanning over 3.7 x 2.1 x 2.6 cm with surrounding vasogenic edema and local mass effect with flattening of the right lateral ventricle without midline shift. The right middle cerebral artery appears dense. Additionally, the hypodensity within the right frontal-temporal lobe and right periatrial region appears more extensive than expected for lenticular nucleus hemorrhage and it may be that the patient had an acute infarct which subsequently blood. Underlying mass as a cause for the abnormality is felt to be less likely consideration and can be addressed as hemorrhage clears and infarct evolves. These results were called by telephone at the time of interpretation on 07/26/2016 at 12:28 pm to Dr. Jola Schmidt , who verbally acknowledged these results. Electronically Signed   By: Genia Del M.D.   On: 07/26/2016 12:42     See A&P below  Etta Quill PA-C [facility] [phone]  07/26/2016, 1:21 PM   Assessment: 83 y.o. female presented to the emergency department with altered mental status, left-sided weakness, and CT results showing a right temporal ICH with significant amount of edema. At this time it is unclear if there is underlying mass with  hemorrhagic transformation of this is a primary ICH.  Stroke Risk Factors - atrial fibrillation and hypertension   Neurohospitalist Addendum S:// Patient seen and examined. Agree with the history, ROS documented above.  O:// Vitals:   07/26/16 1300 07/26/16 1330  BP: 117/68 (!) 118/55  Pulse: 75 (!) 54  Resp: 13 13  Temp:     Gen: WD WN NAD HEENT: [place] AT MMM Clear nares and throat CVS: S1S2+, rrr Resp: CTABL Abd: ND NT Ext: warm, well perfused  Neurological exam MS: Alert, awake, oriented x2. Follows all commands Speech is severely dysarthric Naming, comprehension and repetition intact. CN: PERRL, right gaze preference with inability to cross midline, left hemianopsia, left lower facial weakness, auditory acuity reduced b/l, palate elevation is minimal, voice is very guttural, shoulder shrug normal bilaterally, tongue strength normal. Motor: 0/5 LUE. 1/5 LLE, 5/5 RUE & RLE  Sensory: decreased to LT on left hemibody Coord: intact FNF on right   Labs CMP     Component Value Date/Time   NA 141 07/26/2016 1156   K 4.3 07/26/2016 1156   CL 103 07/26/2016 1156   CO2 25 07/26/2016 1144   GLUCOSE 185 (H) 07/26/2016 1156   BUN 24 (H) 07/26/2016 1156   CREATININE 0.80 07/26/2016 1156   CALCIUM 9.3 07/26/2016 1144   PROT 6.9 07/26/2016 1144   ALBUMIN 3.9 07/26/2016 1144   AST 28 07/26/2016 1144   ALT 16 07/26/2016 1144   ALKPHOS 62 07/26/2016 1144   BILITOT 0.9 07/26/2016 1144   GFRNONAA 46 (L) 07/26/2016 1144   GFRAA 53 (L) 07/26/2016 1144    CBC    Component Value Date/Time   WBC 8.2 07/26/2016 1144   RBC 4.22 07/26/2016 1144   HGB 13.6 07/26/2016 1156   HCT 40.0 07/26/2016 1156   PLT 130 (L) 07/26/2016 1144   MCV 93.8 07/26/2016 1144   MCH 30.3 07/26/2016 1144   MCHC 32.3 07/26/2016 1144   RDW 14.4 07/26/2016 1144   LYMPHSABS 0.8 07/26/2016 1144   MONOABS 0.2 07/26/2016 1144   EOSABS 0.0 07/26/2016 1144   BASOSABS 0.0 07/26/2016 1144     Meds  Current Facility-Administered Medications:  .  [START ON 07/27/2016] levothyroxine (SYNTHROID, LEVOTHROID) tablet 25 mcg, 25 mcg, Oral, QAC breakfast, Marliss Coots, PA-C .  metoprolol tartrate (LOPRESSOR) tablet 50 mg, 50 mg, Oral, BID, Marliss Coots, PA-C  Current Outpatient Prescriptions:  .  B Complex-C (SUPER B COMPLEX PO), Take 1 capsule by mouth daily., Disp: , Rfl:  .  Calcium Carb-Cholecalciferol (CALCIUM 600/VITAMIN D3) 600-800 MG-UNIT TABS, Take 1 tablet by mouth daily., Disp: , Rfl:  .  levothyroxine (SYNTHROID, LEVOTHROID) 25 MCG tablet, Take 25 mcg by mouth daily before breakfast., Disp: , Rfl:  .  metoprolol tartrate (LOPRESSOR) 50 MG tablet, Take 1 tablet (50 mg total) by mouth 2 (two) times daily., Disp: 90 tablet, Rfl: 0  CTH: shows a right BG hemorrhage.   A:// 89/F with PMH of Afib on coumadin, HTN, who was in her usual state of health at a wedding yesterday, able to talk and walk without a problem, noted to have LSW this morning when a neighbor went to check on her at the independent living facility where she resides. NCCT head shows a right basal ganglia bleed with a lot of surrounding edema. It is possible that this is edema related to the [place] secondary to coagulpathy from coumadin. It is also a possibility that there is an underlying mass or vascular malformation and the coagulopathy caused the met or malformation to bleed. Her SBP was WNL hence this is unlikely just a plain hypertensive bleed. All that said, this could still be a coagulopathic bleed without any underlying lesion. In the ER, coagulopathy was reveresed with Vit. K and K-centra.   Recs:// Subcortical ICH, nontraumatic Acuity: Acute  Laterality: right Current suspected etiology: coagulopathy vs. Mass vs. Vascular malformation  Treatment: -Admit to - ICU under stroke service, PCCM consult. -ICH Score: 1 (for age only) -ICH Volume: 9.9 cc -BP control goal SYS<150 -Stat MRI w+w/o - if  concern for mass, consult NSGY' -neuromonitoring -d/c coumadin -reversal done with K-centra and Vit. K  CNS Cerebral edema Compression of brain -Hyperosmolar therapy if this is not due to vasogenic edema. Given Dexamehtasone x1 in ER.  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 DNR  CV Goal SBP<150  Chronic atrial -fibrillation -Rate control -Continue BB   GI/GU -Gentle hydration -check labs in AM  HEME -Monitor -transfuse for hgb < 7  Coagulopathy secondary to anticoagulation Thrombocytopenia -reversed in ER with K-centra. Also given Vit. K -Goal INR is  -Reverse with [place] -Monitor PT/INR and platelets  ENDO Monitor BMP   Fluid/Electrolyte Disorders -Replete -Repeat labs  ID -CXR -NPO -Monitor   Prophylaxis  DVT: - SCDs. No ANTIPLATELETS OR ANTICOAG GI: Protonix Bowel: Doc/senna  Dispo: pending   Diet: NPO until cleared by speech  Code Status: DNR  Discussed in detail with family the detailed plan. Answered questions for them. Called the cardiologist Dr. Fletcher Anon and updated him on family's request. Patient would not like any heroic measures and had clearly verbalized that if she has a stroke, not to be aggressive in the form of surgery. Medical management OK for now per family.  THE FOLLOWING WERE PRESENT ON ADMISSION: CNS -   Cerebral Edema  ICH, Hemiplegia,  Respiratory - Probable Aspiration Pneumonia, Cardiovascular - afib  Stroke Team will be primary and follow the patient.   Amie Portland, MD [facility] [phone]  If 7pm to 7am, please call on call as listed on AMION.

## 2016-07-26 NOTE — Progress Notes (Signed)
OT Cancellation Note  Patient Details Name: [name] [name] MRN: [id number] DOB: 1915-04-12   Cancelled Treatment:    Reason Eval/Treat Not Completed: Patient not medically ready;Medical issues which prohibited therapy. Pt with active bedrest orders. Will await increase in activity orders prior to initiating OT evaluation. Thank you for this referral!  Norman Herrlich, MS OTR/L  Pager: [place] 07/26/2016, 3:15 PM

## 2016-07-26 NOTE — ED Notes (Signed)
Pt reported she needed to urinate. External urinary catheter placed on patient.

## 2016-07-26 NOTE — ED Notes (Signed)
This RN called [facility] and was told that pt was found this morning by her roommate between 9am and 10am this morning.    Pt has no movment to left side of body: face, arm or leg and states she feels no sensation of being touched on her left side. She is able to identify touch to her right face, arm and leg but not left. When speaking, patient is only moving the right side of her mouth.

## 2016-07-26 NOTE — ED Notes (Signed)
Pts family at bedside. Pt reports she has a headache. Dr. Venora Maples at bedside.

## 2016-07-26 NOTE — ED Notes (Signed)
Report attempted 

## 2016-07-26 NOTE — ED Triage Notes (Signed)
Per ems: pt is from [facility] assisted [facility] in [place]. Pt was LSN last night at 8pm by her daughter. Pt usually wakes up at 7am but did not get out of bed this morning so her roommate went to check on her and patient was laying in bed having trouble speaking and left sided weakness. BP- 168/86, HR-86, 96% 2L [place], CBG-176. A&OX4.

## 2016-07-26 NOTE — ED Notes (Signed)
Pt taken to CT at this time.

## 2016-07-26 NOTE — ED Notes (Signed)
Dr. Venora Maples informed of pts condition and came to assess patient at bedside. Dr Venora Maples states to take patient for stat head CT. This RN called Ct an was told there is no availability at this time and she is next. Dr. Venora Maples informed. No code stroke at this time.

## 2016-07-26 NOTE — Progress Notes (Signed)
SLP Cancellation Note  Patient Details Name: Matthew Pais MRN: [id number] DOB: 01-15-1915   Cancelled treatment:       Reason Eval/Treat Not Completed: Medical issues which prohibited therapy; RN reports pt with emesis and lethargy, will attempt swallow evaluation and cognitive linguistic assessment tomorrow pending improvements in mentation and medical status.   Arvil Chaco MA, CCC-SLP Acute Care Speech Language Pathologist    Levi Aland 07/26/2016, 3:29 PM

## 2016-07-27 ENCOUNTER — Inpatient Hospital Stay (HOSPITAL_COMMUNITY): Payer: Medicare Other

## 2016-07-27 ENCOUNTER — Encounter (HOSPITAL_COMMUNITY): Payer: Self-pay | Admitting: *Deleted

## 2016-07-27 DIAGNOSIS — Z515 Encounter for palliative care: Secondary | ICD-10-CM

## 2016-07-27 DIAGNOSIS — Z66 Do not resuscitate: Secondary | ICD-10-CM

## 2016-07-27 DIAGNOSIS — R1310 Dysphagia, unspecified: Secondary | ICD-10-CM

## 2016-07-27 DIAGNOSIS — I610 Nontraumatic intracerebral hemorrhage in hemisphere, subcortical: Secondary | ICD-10-CM

## 2016-07-27 LAB — COMPREHENSIVE METABOLIC PANEL
ALBUMIN: 3.3 g/dL — AB (ref 3.5–5.0)
ALT: 14 U/L (ref 14–54)
ANION GAP: 6 (ref 5–15)
AST: 30 U/L (ref 15–41)
Alkaline Phosphatase: 54 U/L (ref 38–126)
BILIRUBIN TOTAL: 1 mg/dL (ref 0.3–1.2)
BUN: 21 mg/dL — AB (ref 6–20)
CHLORIDE: 108 mmol/L (ref 101–111)
CO2: 25 mmol/L (ref 22–32)
Calcium: 8.7 mg/dL — ABNORMAL LOW (ref 8.9–10.3)
Creatinine, Ser: 0.94 mg/dL (ref 0.44–1.00)
GFR calc Af Amer: 57 mL/min — ABNORMAL LOW (ref >60)
GFR calc non Af Amer: 49 mL/min — ABNORMAL LOW (ref >60)
GLUCOSE: 180 mg/dL — AB (ref 65–99)
POTASSIUM: 4.4 mmol/L (ref 3.5–5.1)
SODIUM: 139 mmol/L (ref 135–145)
TOTAL PROTEIN: 5.8 g/dL — AB (ref 6.5–8.1)

## 2016-07-27 LAB — CBC
HEMATOCRIT: 36.2 % (ref 36.0–46.0)
HEMOGLOBIN: 12 g/dL (ref 12.0–15.0)
MCH: 31.2 pg (ref 26.0–34.0)
MCHC: 33.1 g/dL (ref 30.0–36.0)
MCV: 94 fL (ref 78.0–100.0)
Platelets: 115 10*3/uL — ABNORMAL LOW (ref 150–400)
RBC: 3.85 MIL/uL — ABNORMAL LOW (ref 3.87–5.11)
RDW: 14.7 % (ref 11.5–15.5)
WBC: 10.3 10*3/uL (ref 4.0–10.5)

## 2016-07-27 LAB — PROTIME-INR
INR: 1.16
PROTHROMBIN TIME: 14.8 s (ref 11.4–15.2)

## 2016-07-27 MED ORDER — LORAZEPAM 1 MG PO TABS: 1.0000 mg | ORAL_TABLET | Freq: Four times a day (QID) | ORAL | Status: DC | PRN

## 2016-07-27 MED ORDER — MORPHINE SULFATE (CONCENTRATE) 10 MG/0.5ML PO SOLN
5.0000 mg | ORAL | Status: DC | PRN
  Filled 2016-07-27: qty 0.5

## 2016-07-27 MED ORDER — ORAL CARE MOUTH RINSE
15.0000 mL | Freq: Two times a day (BID) | OROMUCOSAL | Status: DC
  Administered 2016-07-27 – 2016-07-28 (×3): 15 mL via OROMUCOSAL

## 2016-07-27 NOTE — Consult Note (Signed)
Consultation Note Date: 07/27/2016   Patient Name: [name] [name]  DOB: 22-Jan-1915  MRN: [id number]  Age / Sex: 83 y.o., female  PCP: Lowella Dandy, NP Referring Physician: Garvin Fila, MD  Reason for Consultation: Establishing goals of care and Psychosocial/spiritual support  HPI/Patient Profile: 83 y.o. female  admitted on 07/26/2016 after being found down at home with a gaze deviation, left-sided weakness and difficulty speaking.    IMPRESSION: Head Ct  1. Hemorrhagic transformation of right MCA territory infarct with unchanged size of intraparenchymal hematoma centered in the right basal ganglia. 2. Unchanged mass effect on the right lateral ventricle without ventricular trapping or hydrocephalus. Unchanged 3 mm leftward midline shift.  Family face advanced directive decisions and anticipatory care needs  Clinical Assessment and Goals of Care:  This NP Wadie Lessen reviewed medical records, received report from team, assessed the patient and then meet at the patient's bedside along with daughter [facility]/daughter stated HPOA and brother Gene who supports her as such present   to discuss diagnosis, prognosis, GOC, EOL wishes disposition and options.  A detailed discussion was had today regarding advanced directives.  Concepts specific to code status, artifical feeding and hydration, continued IV antibiotics and rehospitalization was had.  The difference between a aggressive medical intervention path  and a palliative comfort care path for this patient at this time was had.  Values and goals of care important to patient and family were attempted to be elicited.  MOST form introduced    Hard Choices left for review  Concept of Hospice and Palliative Care were discussed  Natural trajectory and expectations at EOL were discussed.  Questions and concerns addressed.   Family encouraged to call with  questions or concerns.  PMT will continue to support holistically.   HCPOA/ none presented to hospital- daughter states it is at PCP office and in her mother's home--will try to bring in for scanning    SUMMARY OF RECOMMENDATIONS    -focus of care is comfort and dignity, no life prolonging measures.  Initiate comfort feeds with know risk of aspiration.  No further diagnostics, cardiac monitoring.  -continue IV fluids until discharge then no further artifical hydration   - transition back to her [facility] in [place] with hospice services and 24/hr private care givers  Code Status/Advance Care Planning:  DNR   Symptom Management:   Pain/Dyspnea: Roxanol 5 mg po/sl every 3 hrs prn  Dysphagia: comfort feeds with known risk of aspiration- Pureed diet as tolerated  Agitation: Ativan 1 mg po/sl every 4 hrs prn  Palliative Prophylaxis:   Aspiration, Bowel Regimen, Frequent Pain Assessment and Oral Care  Additional Recommendations (Limitations, Scope, Preferences):  Full Comfort Care  Psycho-social/Spiritual:   Desire for further Chaplaincy support:no  Additional Recommendations: Education on Hospice  Prognosis:   < 2 weeks  Discharge Planning: Home with Hospice      Primary Diagnoses: Present on Admission: . ICH (intracerebral hemorrhage) ([place])   I have reviewed the medical record, interviewed  the patient and family, and examined the patient. The following aspects are pertinent.  Past Medical History:  Diagnosis Date  . Chronic atrial fibrillation (HCC)    used to be on Sotalol in past  . Hypertension   . Thyroid disease    Social History   Social History  . Marital status: Widowed    Spouse name: N/A  . Number of children: 2  . Years of education: N/A   Occupational History  . retired Marine scientist    Social History Main Topics  . Smoking status: Never Smoker  . Smokeless tobacco: Never Used  . Alcohol use No  . Drug  use: No  . Sexual activity: Not Asked   Other Topics Concern  . None   Social History Narrative  . None   Family History  Problem Relation Age of Onset  . Heart failure Unknown   . Dementia Unknown    Scheduled Meds: .  stroke: mapping our early stages of recovery book   Does not apply Once  . levothyroxine  25 mcg Oral QAC breakfast  . mouth rinse  15 mL Mouth Rinse BID  . pantoprazole (PROTONIX) IV  40 mg Intravenous QHS  . senna-docusate  1 tablet Oral BID   Continuous Infusions: . sodium chloride 75 mL/hr at 07/27/16 1400  . niCARDipine Stopped (07/26/16 1621)   PRN Meds:.acetaminophen **OR** acetaminophen (TYLENOL) oral liquid 160 mg/5 mL **OR** acetaminophen, LORazepam, morphine CONCENTRATE, ondansetron (ZOFRAN) IV Medications Prior to Admission:  Prior to Admission medications   Medication Sig Start Date End Date Taking? Authorizing Provider  B Complex-C (SUPER B COMPLEX PO) Take 1 capsule by mouth daily.   Yes [provider]  Calcium Carb-Cholecalciferol (CALCIUM 600/VITAMIN D3) 600-800 MG-UNIT TABS Take 1 tablet by mouth daily.   Yes [provider]  levothyroxine (SYNTHROID, LEVOTHROID) 25 MCG tablet Take 25 mcg by mouth daily before breakfast.   Yes [provider]  metoprolol tartrate (LOPRESSOR) 50 MG tablet Take 1 tablet (50 mg total) by mouth 2 (two) times daily. 07/01/16  Yes Wellington Hampshire, MD   No Known Allergies Review of Systems  Unable to perform ROS: Acuity of condition    Physical Exam  Constitutional: She appears well-developed. She appears ill.  HENT:  Mouth/Throat: Oropharynx is clear and moist.  Cardiovascular: Normal rate, regular rhythm and normal heart sounds.   Pulmonary/Chest: She has decreased breath sounds in the right lower field and the left lower field.  Neurological: She is alert.  - intermittent confusion and ability to follow commands  -left side neglect  Skin: Skin is warm and dry.    Vital  Signs: BP (!) 127/59   Pulse 95   Temp 98 F (36.7 C) (Axillary)   Resp 18   SpO2 100%  Pain Assessment: No/denies pain       SpO2: SpO2: 100 % O2 Device:SpO2: 100 % O2 Flow Rate: .O2 Flow Rate (L/min): 2 L/min  IO: Intake/output summary:  Intake/Output Summary (Last 24 hours) at 07/27/16 1447 Last data filed at 07/27/16 1400  Gross per 24 hour  Intake          1678.75 ml  Output              350 ml  Net          1328.75 ml    LBM: Last BM Date:  (PTA) Baseline Weight:   Most recent weight:       Palliative Assessment/Data:  30 %  at best   [place]     Most Recent Value  Intake Tab  Referral Department  -- [ED]  Unit at Time of Referral  ER  Palliative Care Primary Diagnosis  Neurology  Date Notified  07/26/16  Palliative Care Type  New Palliative care  Reason for referral  Clarify Goals of Care  Date of Admission  07/26/16  Date first seen by Palliative Care  07/27/16  # of days Palliative referral response time  1 Day(s)  # of days IP prior to Palliative referral  0  Clinical Assessment  Psychosocial & Spiritual Assessment  Palliative Care Outcomes     Discussed with Dr Leonie Man  Time In: 1300 Time Out: 1500 Time Total: 120 minutes Greater than 50%  of this time was spent counseling and coordinating care related to the above assessment and plan.  Signed by: Wadie Lessen, NP   Please contact Palliative Medicine Team phone at [phone] for questions and concerns.  For individual provider: See Shea Evans

## 2016-07-27 NOTE — Progress Notes (Signed)
STROKE TEAM PROGRESS NOTE   History of present illness (per record)  [name] [name] is an 83 y/o female with chronic atrial fibrillation, hypertension, who was living with a high degree of independence prior to admission.  Family estimates that she was last seen normal around 2000 on 3/50/0938, but is not certain this is correct.  On the morning of 07/26/2016 she was found with a gaze deviation, left-sided weakness and difficulty speaking. Patient was brought to the emergency department where CT of head showed a right parietal lobe ICH with significant amount of edema surrounding this. It is unclear if this is a primary ICH or a tumor with hemorrhagic transformation. At present time she is awaiting an MRI with and without contrast of her brain. Patient does have chronic A. fib and she is on Coumadin with her recent INR today being 1.58. She was reversed in the ER with K-centra and Vit K.  Family does not know of any form of cancer being melanoma, lung, or breast in the past.  Date last known well: Date: 07/26/2016 Time last known well: Unable to determine Modified Rankin: Rankin Score=0  The patient did not receive tPA secondary to [place].  She was admitted to General Neurology for additional testing and treatment.  SUBJECTIVE (INTERVAL HISTORY) Her son and daughter-in-law are at the bedside.  The patient is awake, confused, and neglecting her left side.  Family meeting today to discuss goals of care.  Patient is now comfort care with Palliative following.   OBJECTIVE Temp:  [97.4 F (36.3 C)-98.2 F (36.8 C)] 98.2 F (36.8 C) (07/24 0400) Pulse Rate:  [50-151] 87 (07/24 0700) Cardiac Rhythm: Atrial fibrillation (07/24 0721) Resp:  [11-24] 19 (07/24 0700) BP: (85-166)/(44-114) 131/74 (07/24 0700) SpO2:  [85 %-100 %] 98 % (07/24 0700)  CBC:  Recent Labs Lab 07/26/16 1144 07/26/16 1156 07/27/16 0307  WBC 8.2  --  10.3  NEUTROABS 7.2  --   --   HGB 12.8 13.6 12.0  HCT 39.6 40.0 36.2   MCV 93.8  --  94.0  PLT 130*  --  115*    Basic Metabolic Panel:  Recent Labs Lab 07/26/16 1144 07/26/16 1156 07/27/16 0307  NA 140 141 139  K 4.3 4.3 4.4  CL 104 103 108  CO2 25  --  25  GLUCOSE 190* 185* 180*  BUN 18 24* 21*  CREATININE 0.99 0.80 0.94  CALCIUM 9.3  --  8.7*    Lipid Panel: No results found for: CHOL, TRIG, HDL, CHOLHDL, VLDL, LDLCALC HgbA1c: No results found for: HGBA1C Urine Drug Screen: No results found for: LABOPIA, COCAINSCRNUR, LABBENZ, AMPHETMU, THCU, LABBARB  Alcohol Level No results found for: ETH  IMAGING  Ct Head Wo Contrast 07/27/2016 IMPRESSION: 1. Hemorrhagic transformation of right MCA territory infarct with unchanged size of intraparenchymal hematoma centered in the right basal ganglia. 2. Unchanged mass effect on the right lateral ventricle without ventricular trapping or hydrocephalus. Unchanged 3 mm leftward midline shift.   Ct Head Wo Contrast 07/26/2016 IMPRESSION: Right lenticular nucleus hemorrhage with a slightly bilobed appearance spanning over 3.7 x 2.1 x 2.6 cm with surrounding vasogenic edema and local mass effect with flattening of the right lateral ventricle without midline shift. The right middle cerebral artery appears dense. Additionally, the hypodensity within the right frontal-temporal lobe and right periatrial region appears more extensive than expected for lenticular nucleus hemorrhage and it may be that the patient had an acute infarct which subsequently blood. Underlying mass as  a cause for the abnormality is felt to be less likely consideration and can be addressed as hemorrhage clears and infarct evolves.  Mr Jeri Cos Wo Contrast 07/26/2016 IMPRESSION: 1. Large evolving acute right MCA territory infarct. Superimposed 3.3 x 2.5 x 3.2 cm hematoma centered at the right lentiform nucleus most consistent with associated hemorrhagic transformation. Localized edema and mass effect with trace 2 mm right-to-left shift. 2. Occlusive  thrombus within distal right M1/M2 branches. 3. Mild age-related cerebral atrophy with chronic small vessel ischemic disease.    PHYSICAL EXAM Frail elderly caucasian lady not in distress. . Afebrile. Head is nontraumatic. Neck is supple without bruit.    Cardiac exam no murmur or gallop. Lungs are clear to auscultation. Distal pulses are well felt. Neurological Exam :  Awake alert right right head deviation and right gaze deviation. Speech is clear and can be understood. No aphasia. Oriented to person only. Diminished attentionstation recall. Blinks to threat on the right but not on the left. Fundi are not visualized. Left hemi-neglect. Would not recognize her own left hand. Dense left hemiplegia with hypotonia. The post was antigravity movements on the right side. Right sided deep tendon reflexes are preserved and left-sided reflexes are absent. Right plantar downgoing left upgoing. ASSESSMENT/PLAN [name] [name] is a 83 y.o. female with history of chronic atrial fibrillation, hypertension, and thyroid disease who presented with gaze deviation, left-sided weakness and difficulty speaking.  She did not receive IV t-PA due to [place].   Stroke:  Large acute R MCA infarct with hemorrhagic transformation and vasogenic edema, and 2 mm right-to-left shift as well as distal M1/M2 branch occlusions, likely cardioembolic in the setting of atrial fibrillation on Coumadin with subtherapeutic INR.  Resultant  left hemiplegia, left hemi-neglect, left gaze palsy  CT head: right lenticular nucleus hemorrhage with edema and mass effect and 49mm leftward shift.  Dense R MCA.     MRI head: Large acute R MCA infarct with hemorrhagic transformation and vasogenic edema, and 2 mm right-to-left shift as well as distal M1/M2 branch occlusions  LDL not ordered: comfort care  HgbA1c not ordered: comfort care  SCDs for VTE prophylaxis  Diet NPO time specified  warfarin daily prior to admission, now on No  antithrombotic  Patient counseled to be compliant with her antithrombotic medications  Ongoing aggressive stroke risk factor management  Therapy recommendations:  not ordered: comfort care  Disposition:  pending  Hypertension  Stable  Long-term BP goal normotensive  Hyperlipidemia  Home meds:  None  LDL not ordered: comfort care  Other Stroke Risk Factors  Advanced age  Other Active Problems  None  Hospital day # 1  I have personally examined this patient, reviewed notes, independently viewed imaging studies, participated in medical decision making and plan of care.ROS completed by me personally and pertinent positives fully documented  I have made any additions or clarifications directly to the above note. She has presented with hemorrhagic large right middle cerebral artery infarct likely secondary to atrial fibrillation. Her clinical exam and neurological prognosis is quite poor given her advanced age. I had a long discussion with the patient's son and daughter at the bedside along with the palliative care team. Family   Clearly do not want to pursue aggressive measures and elected on palliative and comfort care measures only. Patient will be transferred home tomorrow with home hospice. This patient is critically ill and at significant risk of neurological worsening, death and care requires constant monitoring of vital signs, hemodynamics,respiratory  and cardiac monitoring, extensive review of multiple databases, frequent neurological assessment, discussion with family, other specialists and medical decision making of high complexity.I have made any additions or clarifications directly to the above note.This critical care time does not reflect procedure time, or teaching time or supervisory time of PA/NP/Med Resident etc but could involve care discussion time.  I spent 70 minutes of neurocritical care time  in the care of  this patient.     Antony Contras, MD Medical  Director [facility] Stroke Center Pager: [phone] 07/27/2016 4:21 PM   To contact Stroke Continuity provider, please refer to [email or web address] After hours, contact General Neurology

## 2016-07-27 NOTE — Progress Notes (Signed)
Hospital bed to be delivered tonight between 1800 and 2200. Family has arranged for next door neighbor, [place] [phone]), to be available for the delivery.

## 2016-07-27 NOTE — Plan of Care (Signed)
Problem: Health Behavior/Discharge Planning: Goal: Ability to manage health-related needs will improve Outcome: Progressing Patient and family have a meeting scheduled with palliative care today and will discuss goals of care. Family and staff ensuring patient is comfortable and meeting her needs through a collaborative effort.

## 2016-07-27 NOTE — Care Management Note (Addendum)
Case Management Note  Patient Details  Name: [name] [name] MRN: [id number] Date of Birth: 16-Sep-1915  Subjective/Objective:  Pt admitted on 07/26/16 with ICH.  PTA, pt resided at [facility] in [place], and was independent.                    Action/Plan: Pt prefers DNR and no aggressive care.  Palliative Care Team met with family today; they prefer discharge to patient's residence ASAP with [facility].  Faxed referral to [facility] agency (fax [phone]).  Family given list of [place] agencies to call to arrange 24h nurses, per family's request.  Requested DME includes hospital bed, 3 in 1, wheelchair and oxygen.  Await word from Hospice agency regarding DME delivery; hopeful for discharge home today with hospice, if DME can be delivered today and hospice staff can accommodate.    Expected Discharge Date:                  Expected Discharge Plan:  Home w Hospice Care  In-House Referral:  Chaplain  Discharge planning Services  CM Consult  Post Acute Care Choice:  Hospice Choice offered to:  Adult Children  DME Arranged:  3-N-1, Hospital bed, Oxygen, Wheelchair DME Agency:     HH Arranged:    [place] Agency:     Status of Service:     If discussed at [employer] of Stay Meetings, dates discussed:    Additional Comments:  07/27/16 Received word from [place] with [facility] that [facility] will deliver DME between 6p and 10p tonight.  I spoke with pt's son and daughter who are trying to arrange someone to be at pt's home to receive equipment during these hours.  Due to the late delivery of DME, feel it is more appropriate to arrange discharge tomorrow, and family in agreement.  Bedside nurse to notify attending that pt will remain in house until tomorrow.    Reinaldo Raddle, RN, BSN  Trauma/Neuro ICU Case Manager [phone]

## 2016-07-27 NOTE — Evaluation (Signed)
Clinical/Bedside Swallow Evaluation Patient Details  Name: [name] [name] MRN: [id number] Date of Birth: [birth year]  Today's Date: 07/27/2016 Time: SLP Start Time (ACUTE ONLY): 0840 SLP Stop Time (ACUTE ONLY): 0905 SLP Time Calculation (min) (ACUTE ONLY): 25 min  Past Medical History:  Past Medical History:  Diagnosis Date  . Chronic atrial fibrillation (HCC)    used to be on Sotalol in past  . Hypertension   . Thyroid disease    Past Surgical History:  Past Surgical History:  Procedure Laterality Date  . hysterectomy - unknown type     HPI:  Pt is a 83 y.o. female admitted 7/23 with gaze deviation, L side weakness and difficulty speaking. MRI showed large evolving acute R MCA infarct. Superimposed 3.3 x 2.5 x 3.2 cm hematoma centered at the right lentiform nucleus most consistent with associated hemorrhagic transformation. Localized edema and mass effect with trace 2 mm right-to-left shift; repeat head CT on 7/24 showed hemorrhagic transformation of infarct with unchanged size of intraparenchymal hematoma centered in the right basal ganglia. Pt failed RN swallow screen due to not being positioned upright/ head control. Bedside swallow eval/ cognitive linguistic eval ordered.   Assessment / Plan / Recommendation Clinical Impression  Pt demonstrated overt s/s of aspiration (cough) which was immediate following 50% of trials of thin liquid and delayed following trial of puree consistency. Dysphagia is impacted by motor and cognitive status, especially decreased sustained attention to bolus. Pt is at an increased risk of aspiration and would benefit from a MBS to objectively evaluate swallow function. Plan for MBS this afternoon. Until then, recommend that pt remain NPO with frequent oral care. Spoke at length with daughter-in-law and son who are in agreement with recommendations. SLP Visit Diagnosis: Dysphagia, unspecified (R13.10)    Aspiration Risk  Severe aspiration risk    Diet  Recommendation NPO   Medication Administration: Via alternative means    Other  Recommendations Oral Care Recommendations: Oral care QID Other Recommendations: Have oral suction available   Follow up Recommendations Skilled Nursing facility      Frequency and Duration min 3x week  2 weeks       Prognosis Prognosis for Safe Diet Advancement:  (TBD)      Swallow Study   General HPI: Pt is a 83 y.o. female admitted 7/23 with gaze deviation, L side weakness and difficulty speaking. MRI showed large evolving acute R MCA infarct. Superimposed 3.3 x 2.5 x 3.2 cm hematoma centered at the right lentiform nucleus most consistent with associated hemorrhagic transformation. Localized edema and mass effect with trace 2 mm right-to-left shift; repeat head CT on 7/24 showed hemorrhagic transformation of infarct with unchanged size of intraparenchymal hematoma centered in the right basal ganglia. Pt failed RN swallow screen due to not being positioned upright/ head control. Bedside swallow eval/ cognitive linguistic eval ordered. Type of Study: Bedside Swallow Evaluation Previous Swallow Assessment: none in chart Diet Prior to this Study: NPO Temperature Spikes Noted: No Respiratory Status: Nasal cannula History of Recent Intubation: No Behavior/Cognition: Alert;Lethargic/Drowsy;Requires cueing Oral Cavity Assessment: Within Functional Limits Oral Care Completed by SLP: Yes Oral Cavity - Dentition: Adequate natural dentition Vision: Impaired for self-feeding Self-Feeding Abilities: Needs assist Patient Positioning: Upright in bed Baseline Vocal Quality: Normal Volitional Cough: Weak Volitional Swallow: Able to elicit    Oral/Motor/Sensory Function Overall Oral Motor/Sensory Function: Moderate impairment Facial ROM: Reduced left;Suspected CN VII (facial) dysfunction Facial Symmetry: Abnormal symmetry left Facial Strength: Reduced left Lingual ROM: Within Functional Limits Lingual Symmetry:  Within Functional Limits   Ice Chips Ice chips: Not tested   Thin Liquid Thin Liquid: Impaired Presentation: Straw Pharyngeal  Phase Impairments: Cough - Immediate    Nectar Thick Nectar Thick Liquid: Not tested   Honey Thick Honey Thick Liquid: Not tested   Puree Puree: Impaired Presentation: Spoon Oral Phase Impairments: Poor awareness of bolus Oral Phase Functional Implications: Oral residue Pharyngeal Phase Impairments: Multiple swallows;Cough - Delayed   Solid   GO   Solid: Not tested        Kern Reap, MA, CCC-SLP 07/27/2016,9:20 AM [phone]

## 2016-07-27 NOTE — Progress Notes (Signed)
OT Cancellation Note  Patient Details Name: [name] [name] MRN: [id number] DOB: 03-Sep-1915   Cancelled Treatment:    Reason Eval/Treat Not Completed: Medical issues which prohibited therapy.Pt continues with active bed rest orders. Will await increased activity orders prior to initiating OT eval.   Almon Register [phone] 07/27/2016, 7:46 AM

## 2016-07-27 NOTE — Progress Notes (Signed)
PT Cancellation Note  Patient Details Name: [name] [name] MRN: [id number] DOB: September 28, 1915   Cancelled Treatment:    Reason Eval/Treat Not Completed: Patient not medically ready Pt with active bed rest orders. Will await increased activity orders prior to initiating PT eval.   Thanks!  Elberta Leatherwood, [place] [facility] 07/27/2016, 7:41 AM

## 2016-07-27 NOTE — Evaluation (Addendum)
Speech Language Pathology Evaluation Patient Details Name: [name] [name] MRN: [id number] DOB: 22-Jan-1915 Today's Date: 07/27/2016 Time: 2992-4268 SLP Time Calculation (min) (ACUTE ONLY): 25 min  Problem List:  Patient Active Problem List   Diagnosis Date Noted  . ICH (intracerebral hemorrhage) ([place]) 07/26/2016  . Falls 09/19/2012  . Edema of both legs 02/23/2012  . Chronic atrial fibrillation [facility])    Past Medical History:  Past Medical History:  Diagnosis Date  . Chronic atrial fibrillation (HCC)    used to be on Sotalol in past  . Hypertension   . Thyroid disease    Past Surgical History:  Past Surgical History:  Procedure Laterality Date  . hysterectomy - unknown type     HPI:  Pt is a 83 y.o. female admitted 7/23 with gaze deviation, L side weakness and difficulty speaking. MRI showed large evolving acute R MCA infarct. Superimposed 3.3 x 2.5 x 3.2 cm hematoma centered at the right lentiform nucleus most consistent with associated hemorrhagic transformation. Localized edema and mass effect with trace 2 mm right-to-left shift; repeat head CT on 7/24 showed hemorrhagic transformation of infarct with unchanged size of intraparenchymal hematoma centered in the right basal ganglia. Pt failed RN swallow screen due to not being positioned upright/ head control. Bedside swallow eval/ cognitive linguistic eval ordered.   Assessment / Plan / Recommendation Clinical Impression  Pt currently presenting with moderate cognitive impairments and dysarthria. Cognitive deficits characterized by reduced awareness of deficits, problem solving, inconsistent awareness of location, reduced sustained attention which was inconsistent, left neglect. Conversational speech judged to be ~60-70% intelligible due to deficits in articulation. Expressive language relatively intact as the pt is conversant at bedside, answering most questions appropriately with some perseveration. Pt will benefit from continued  SLP services to address cognition and speech intelligibility, while in hospital and at next level of care. Will continue to follow.     SLP Assessment  SLP Recommendation/Assessment: Patient needs continued Speech Lanaguage Pathology Services SLP Visit Diagnosis: Dysphagia, unspecified (R13.10);Dysarthria and anarthria (R47.1);Cognitive communication deficit (R41.841)    Follow Up Recommendations  Skilled Nursing facility    Frequency and Duration min 3x week  2 weeks      SLP Evaluation Cognition  Overall Cognitive Status: Impaired/Different from baseline Arousal/Alertness: Lethargic Orientation Level: Oriented to person;Oriented to time;Oriented to situation Attention: Sustained;Selective Sustained Attention: Impaired Sustained Attention Impairment: Verbal basic;Functional basic Selective Attention: Impaired Selective Attention Impairment: Verbal basic;Functional basic Memory:  (difficult to assess due to attention deficit) Awareness: Impaired Problem Solving: Impaired Problem Solving Impairment: Verbal basic;Functional basic Safety/Judgment: Impaired       Comprehension  Auditory Comprehension Overall Auditory Comprehension: Impaired Yes/No Questions: Within Functional Limits Commands:  (1 step- intact) Conversation: Simple Other Conversation Comments:  (some perseveration) Visual Recognition/Discrimination Discrimination: Exceptions to [facility]    Expression Expression Primary Mode of Expression: Verbal Verbal Expression Overall Verbal Expression: Appears within functional limits for tasks assessed Level of Generative/Spontaneous Verbalization: Conversation Naming: No impairment Pragmatics:  (flat affect) Interfering Components: Speech intelligibility Non-Verbal Means of Communication: Not applicable   Oral / Motor  Oral Motor/Sensory Function Overall Oral Motor/Sensory Function: Moderate impairment Facial ROM: Reduced left;Suspected CN VII (facial)  dysfunction Facial Symmetry: Abnormal symmetry left Facial Strength: Reduced left Lingual ROM: Within Functional Limits Lingual Symmetry: Within Functional Limits Motor Speech Overall Motor Speech: Impaired Respiration: Within functional limits Phonation: Normal Resonance: Within functional limits Articulation: Impaired Level of Impairment: Sentence Intelligibility: Intelligibility reduced Word: 75-100% accurate Sentence: 50-74% accurate Conversation: 50-74% accurate Motor  Planning: Witnin functional limits Motor Speech Errors: Unaware   GO                    [place], [place], CCC-SLP 07/27/2016, 9:30 AM [phone]

## 2016-07-27 NOTE — Progress Notes (Signed)
Family meeting with palliative care. Decision has been made to take patient home with hospice. All monitoring has been removed, fluids have been continued. Patient will be allowed to have pureed foods for comfort.

## 2016-07-28 ENCOUNTER — Encounter (HOSPITAL_COMMUNITY): Payer: Self-pay

## 2016-07-28 LAB — PROTIME-INR
INR: 1.19
PROTHROMBIN TIME: 15.1 s (ref 11.4–15.2)

## 2016-07-28 MED ORDER — MORPHINE SULFATE (CONCENTRATE) 10 MG/0.5ML PO SOLN: 5.0000 mg | ORAL | Status: DC | PRN

## 2016-07-28 MED ORDER — MORPHINE SULFATE (CONCENTRATE) 10 MG/0.5ML PO SOLN: 5.0000 mg | ORAL | 0 refills | Status: AC | PRN

## 2016-07-28 MED ORDER — MORPHINE SULFATE (PF) 2 MG/ML IV SOLN
1.0000 mg | INTRAVENOUS | Status: DC | PRN
  Administered 2016-07-28 (×3): 1 mg via INTRAVENOUS
  Filled 2016-07-28 (×4): qty 1

## 2016-07-28 MED ORDER — ONDANSETRON 4 MG PO TBDP: 4.0000 mg | ORAL_TABLET | Freq: Three times a day (TID) | ORAL | 0 refills | Status: AC | PRN

## 2016-07-28 MED ORDER — ACETAMINOPHEN 160 MG/5ML PO SOLN: 650.0000 mg | ORAL | 0 refills | Status: AC | PRN

## 2016-07-28 MED ORDER — GLYCOPYRROLATE 0.2 MG/ML IJ SOLN
0.4000 mg | Freq: Three times a day (TID) | INTRAMUSCULAR | Status: DC
  Administered 2016-07-28: 0.4 mg via INTRAVENOUS
  Filled 2016-07-28: qty 2

## 2016-07-28 NOTE — Care Management Note (Signed)
Case Management Note  Patient Details  Name: [name] [name] MRN: [id number] Date of Birth: November 06, 1915  Subjective/Objective:  Pt admitted on 07/26/16 with ICH.  PTA, pt resided at [facility] in [place], and was independent.                    Action/Plan: Pt prefers DNR and no aggressive care.  Palliative Care Team met with family today; they prefer discharge to patient's residence ASAP with [facility].  Faxed referral to [facility] agency (fax [phone]).  Family given list of [place] agencies to call to arrange 24h nurses, per family's request.  Requested DME includes hospital bed, 3 in 1, wheelchair and oxygen.  Await word from Hospice agency regarding DME delivery; hopeful for discharge home today with hospice, if DME can be delivered today and hospice staff can accommodate.    Expected Discharge Date:                  Expected Discharge Plan:  Home w Hospice Care  In-House Referral:  Chaplain  Discharge planning Services  CM Consult  Post Acute Care Choice:  Hospice Choice offered to:  Adult Children  DME Arranged:  3-N-1, Hospital bed, Oxygen, Wheelchair manual, Wheelchair DME Agency:     HH Arranged:    [place] Agency:     Status of Service:  In process, will continue to follow  If discussed at Long Length of Stay Meetings, dates discussed:    Additional Comments:  07/27/16 Received word from [place] with [facility] that [facility] will deliver DME between 6p and 10p tonight.  I spoke with pt's son and daughter who are trying to arrange someone to be at pt's home to receive equipment during these hours.  Due to the late delivery of DME, feel it is more appropriate to arrange discharge tomorrow, and family in agreement.  Bedside nurse to notify attending that pt will remain in house until tomorrow.    07/28/16 J. Landy Mace, Therapist, sports, BSN [phone] Received call from [place] with [facility] that family has now decided to have pt  discharge to residential [facility] in [place], RN with Hospice agency to visit pt in hospital and follow up with [place] to facilitate discharge to hospice facility.  Will update CM/CSW.    Reinaldo Raddle, RN, BSN  Trauma/Neuro ICU Case Manager [phone]

## 2016-07-28 NOTE — Care Management Note (Signed)
Case Management Note  Patient Details  Name: [name] [name] MRN: [id number] Date of Birth: Oct 07, 1915  Subjective/Objective:                    Action/Plan: Pt discharging to [name] today. No further needs per CM.   Expected Discharge Date:                  Expected Discharge Plan:  Home w Hospice Care  In-House Referral:  Chaplain, Clinical Social Work  Discharge planning Services  CM Consult  Post Acute Care Choice:  Hospice Choice offered to:  Adult Children  DME Arranged:    DME Agency:     HH Arranged:    HH Agency:     Status of Service:  Completed, signed off  If discussed at [place] of Stay Meetings, dates discussed:    Additional Comments:  Pollie Friar, RN 07/28/2016, 1:22 PM

## 2016-07-28 NOTE — Progress Notes (Signed)
Patient ID: [name] [name], female   DOB: 1915-11-15, 83 y.o.   MRN: [id number]   This NP visited patient at the bedside with daughter as a follow up to  yesterday's [place]. Continued conversation regarding diagnosis, prognosis, GOC, EOL wishes disposition and options.  Daughter understands the limited prognosis. Priority of care is comfort.  Daughter was verbalizing anxiety around her decision to take her mother home, we discussed the option of hospice facility.     Family agrees that a hospice facility would be best, will place order for SW.    Natural trajectory and expectations at EOL were discussed.  Questions and concerns addressed.   Family encouraged to call with questions or concerns.  PMT will continue to support holistically.  Questions and concerns addressed    Discussed with Dr Leonie Man  Time in  0845         Time out  0930    Total time spent on the unit 45 minutes was   Greater than 50% of the time was spent in counseling and coordination of care  Wadie Lessen NP  Palliative Medicine Team Team Phone # [phone] Pager [phone]

## 2016-07-28 NOTE — Progress Notes (Signed)
Pt being discharged to [facility] today per orders from MD. Pt and family are aware of transfer. All questions and concerns were addressed. Pt's IV was kept and maintained so pt would have IV access at Hospice. RN called and gave report to [place] at [facility].

## 2016-07-28 NOTE — Social Work (Signed)
Clinical Social Worker facilitated patient discharge including contacting patient family and facility to confirm patient discharge plans.    Patient will be transported to [facility] via [facility]. Pt will go to Room 104.  RN to call [phone] to give report prior to discharge.  Clinical Social Worker will sign off for now as social work intervention is no longer needed. Please consult [place] again if new need arises.  Elissa Hefty, LCSW Clinical Social Worker [phone]

## 2016-07-28 NOTE — Discharge Summary (Signed)
Stroke Discharge Summary  Patient ID: [name] [name]   MRN: [id number]      DOB: 07-Aug-1915  Date of Admission: 07/26/2016 Date of Discharge: 07/28/2016  Attending Physician:  Garvin Fila, MD, Stroke MD Consultant(s):    Palliative Patient's PCP:  Lowella Dandy, NP   DISCHARGE DIAGNOSIS:  Principal Problem:   ICH (intracerebral hemorrhage) ([place]) -  Large acute R MCA infarct with hemorrhagic transformation and vasogenic edema, and 2 mm right-to-left shift as well as distal M1/M2 branch occlusions, likely ca Active Problems:   Dysphagia   Palliative care by specialist   DNR (do not resuscitate)   Past Medical History:  Diagnosis Date  . Chronic atrial fibrillation (HCC)    used to be on Sotalol in past  . Hypertension   . Thyroid disease    Past Surgical History:  Procedure Laterality Date  . hysterectomy - unknown type      Allergies as of 07/28/2016   No Known Allergies     Medication List    STOP taking these medications   CALCIUM 600/VITAMIN D3 600-800 MG-UNIT Tabs Generic drug:  Calcium Carb-Cholecalciferol   levothyroxine 25 MCG tablet Commonly known as:  SYNTHROID, LEVOTHROID   metoprolol tartrate 50 MG tablet Commonly known as:  LOPRESSOR   SUPER B COMPLEX PO     TAKE these medications   acetaminophen 160 MG/5ML solution Commonly known as:  TYLENOL Place 20.3 mLs (650 mg total) into feeding tube every 4 (four) hours as needed for mild pain (or temp > 37.5 C (99.5 F)).   morphine CONCENTRATE 10 MG/0.5ML Soln concentrated solution Take 0.25 mLs (5 mg total) by mouth every 2 (two) hours as needed for moderate pain (dyspnea).   ondansetron 4 MG disintegrating tablet Commonly known as:  ZOFRAN ODT Take 1 tablet (4 mg total) by mouth every 8 (eight) hours as needed for nausea or vomiting.       LABORATORY STUDIES CBC    Component Value Date/Time   WBC 10.3 07/27/2016 0307   RBC 3.85 (L) 07/27/2016 0307   HGB 12.0 07/27/2016 0307   HCT  36.2 07/27/2016 0307   PLT 115 (L) 07/27/2016 0307   MCV 94.0 07/27/2016 0307   MCH 31.2 07/27/2016 0307   MCHC 33.1 07/27/2016 0307   RDW 14.7 07/27/2016 0307   LYMPHSABS 0.8 07/26/2016 1144   MONOABS 0.2 07/26/2016 1144   EOSABS 0.0 07/26/2016 1144   BASOSABS 0.0 07/26/2016 1144   CMP    Component Value Date/Time   NA 139 07/27/2016 0307   K 4.4 07/27/2016 0307   CL 108 07/27/2016 0307   CO2 25 07/27/2016 0307   GLUCOSE 180 (H) 07/27/2016 0307   BUN 21 (H) 07/27/2016 0307   CREATININE 0.94 07/27/2016 0307   CALCIUM 8.7 (L) 07/27/2016 0307   PROT 5.8 (L) 07/27/2016 0307   ALBUMIN 3.3 (L) 07/27/2016 0307   AST 30 07/27/2016 0307   ALT 14 07/27/2016 0307   ALKPHOS 54 07/27/2016 0307   BILITOT 1.0 07/27/2016 0307   GFRNONAA 49 (L) 07/27/2016 0307   GFRAA 57 (L) 07/27/2016 0307   COAGS Lab Results  Component Value Date   INR 1.19 07/28/2016   INR 1.16 07/27/2016   INR 1.12 07/26/2016   Lipid PanelNo results found for: CHOL, TRIG, HDL, CHOLHDL, VLDL, LDLCALC HgbA1C No results found for: HGBA1C Urinalysis    Component Value Date/Time   COLORURINE AMBER (A) 07/26/2016 1400   APPEARANCEUR CLOUDY (A)  07/26/2016 1400   LABSPEC 1.016 07/26/2016 1400   PHURINE 6.0 07/26/2016 1400   GLUCOSEU NEGATIVE 07/26/2016 1400   HGBUR NEGATIVE 07/26/2016 1400   BILIRUBINUR NEGATIVE 07/26/2016 1400   KETONESUR 5 (A) 07/26/2016 1400   PROTEINUR NEGATIVE 07/26/2016 1400   NITRITE NEGATIVE 07/26/2016 1400   LEUKOCYTESUR NEGATIVE 07/26/2016 1400   Urine Drug Screen No results found for: LABOPIA, COCAINSCRNUR, LABBENZ, AMPHETMU, THCU, LABBARB  Alcohol Level No results found for: [facility]   SIGNIFICANT DIAGNOSTIC STUDIES Ct Head Wo Contrast 07/27/2016 IMPRESSION: 1. Hemorrhagic transformation of right MCA territory infarct with unchanged size of intraparenchymal hematoma centered in the right basal ganglia. 2. Unchanged mass effect on the right lateral ventricle without ventricular  trapping or hydrocephalus. Unchanged 3 mm leftward midline shift.   Ct Head Wo Contrast 07/26/2016 IMPRESSION: Right lenticular nucleus hemorrhage with a slightly bilobed appearance spanning over 3.7 x 2.1 x 2.6 cm with surrounding vasogenic edema and local mass effect with flattening of the right lateral ventricle without midline shift. The right middle cerebral artery appears dense. Additionally, the hypodensity within the right frontal-temporal lobe and right periatrial region appears more extensive than expected for lenticular nucleus hemorrhage and it may be that the patient had an acute infarct which subsequently blood. Underlying mass as a cause for the abnormality is felt to be less likely consideration and can be addressed as hemorrhage clears and infarct evolves.  Mr Jeri Cos Wo Contrast 07/26/2016 IMPRESSION: 1. Large evolving acute right MCA territory infarct. Superimposed 3.3 x 2.5 x 3.2 cm hematoma centered at the right lentiform nucleus most consistent with associated hemorrhagic transformation. Localized edema and mass effect with trace 2 mm right-to-left shift. 2. Occlusive thrombus within distal right M1/M2 branches. 3. Mild age-related cerebral atrophy with chronic small vessel ischemic disease.    HISTORY OF PRESENT ILLNESS [name] [name] is an 83 y/o female with chronic atrial fibrillation, hypertension, who was living with a high degree of independence prior to admission.  Family estimates that she was last seen normal around 2000 on 2/54/2706, but is not certain this is correct.  On the morning of 07/26/2016 she was found with a gaze deviation, left-sided weakness and difficulty speaking. Patient was brought to the emergency department where CT of head showed a right parietal lobe ICH with significant amount of edema surrounding this. It is unclear if this is a primary ICH or a tumor with hemorrhagic transformation. At present time she is awaiting an MRI with and without contrast of  her brain. Patient does have chronic A. fib and she is on Coumadin with her recent INR today being 1.58. She was reversed in the ER with K-centra and Vit K.  Family does not know of any form of cancer being melanoma, lung, or breast in the past.  Date last known well: Date: 07/26/2016 Time last known well: Unable to determine Modified Rankin: Rankin Score=0  The patient did not receive tPA secondary to [place].  She was admitted to General Neurology for additional testing and treatment.   HOSPITAL COURSE Ms. [name] [name] is a 83 y.o. female with history of chronic atrial fibrillation, hypertension, and thyroid disease who presented with gaze deviation, left-sided weakness and difficulty speaking.  She did not receive IV t-PA due to [place].   Stroke:  Large acute R MCA infarct with hemorrhagic transformation and vasogenic edema, and 2 mm right-to-left shift as well as distal M1/M2 branch occlusions, likely cardioembolic in the setting of atrial fibrillation on Coumadin with subtherapeutic  INR.  Resultant  left hemiplegia, left hemi-neglect, left gaze palsy  CT head: right lenticular nucleus hemorrhage with edema and mass effect and 40mm leftward shift.  Dense R MCA.     MRI head: Large acute R MCA infarct with hemorrhagic transformation and vasogenic edema, and 2 mm right-to-left shift as well as distal M1/M2 branch occlusions  LDL not ordered: comfort care  HgbA1c not ordered: comfort care  SCDs for VTE prophylaxis  Diet NPO time specified  warfarin daily prior to admission, now on No antithrombotic  Patient counseled to be compliant with her antithrombotic medications  Ongoing aggressive stroke risk factor management  Therapy recommendations:  not ordered: comfort care  Disposition:  pending  Hypertension  Stable  Long-term BP goal normotensive  Hyperlipidemia  Home meds:  None  LDL not ordered: comfort care  Other Stroke Risk Factors  Advanced age  Other  Active Problems  None  DISCHARGE EXAM Blood pressure (!) 151/61, pulse 83, temperature 98.4 F (36.9 C), temperature source Axillary, resp. rate 18, SpO2 100 %. Frail elderly caucasian lady not in distress. . Afebrile. Head is nontraumatic. Neck is supple without bruit.  Cardiac exam no murmur or gallop. Lungs are clear to auscultation. Distal pulses are well felt. Neurological Exam :  Awake alert right right head deviation and right gaze deviation. Speech is clear and can be understood. No aphasia. Oriented to person only. Diminished attentionstation recall. Blinks to threat on the right but not on the left. Fundi are not visualized. Left hemi-neglect. Would not recognize her own left hand. Dense left hemiplegia with hypotonia. The post was antigravity movements on the right side. Right sided deep tendon reflexes are preserved and left-sided reflexes are absent. Right plantar downgoing left upgoing  Discharge Diet   DIET - DYS 1 Room service appropriate? Yes; Fluid consistency: Thin liquids  DISCHARGE PLAN  Disposition:  Discharge to home hospice  No antithrombotic for secondary stroke prevention.  Ongoing risk factor control by Primary Care Physician at time of discharge  45 minutes were spent preparing discharge.  I have personally examined this patient, reviewed notes, independently viewed imaging studies, participated in medical decision making and plan of care.ROS completed by me personally and pertinent positives fully documented  I have made any additions or clarifications directly to the above note.    Antony Contras, MD Medical Director [facility] Pager: [phone] 07/28/2016 4:18 PM

## 2016-07-28 NOTE — Clinical Social Work Note (Signed)
Clinical Social Work Assessment  Patient Details  Name: Estalee Mccandlish MRN: [id number] Date of Birth: 11-02-1915  Date of referral:  07/28/16               Reason for consult:  Facility Placement                Permission sought to share information with:  Chartered certified accountant granted to share information::  Yes, Verbal Permission Granted  Name::        Agency::  [facility]  Relationship::     Contact Information:     Housing/Transportation Living arrangements for the past 2 months:  Single Family Home Source of Information:  Adult Children Patient Interpreter Needed:  None Criminal Activity/Legal Involvement Pertinent to Current Situation/Hospitalization:  No - Comment as needed Significant Relationships:  Adult Children, Other Family Members Lives with:  Adult Children Do you feel safe going back to the place where you live?  No Need for family participation in patient care:  Yes (Comment)  Care giving concerns:  CSW received call from [facility] advising that they have accepted patient for placement. Patient will go to Room 104. CSW will assist with setting up transport to [place]. CSW will f/u with Dr. Leonie Man for DC summary and set up transport.  Social Worker assessment / plan:  CSW will assist with transporting and transitioning patient to [place].  Employment status:  Retired Forensic scientist:  Medicare PT Recommendations:  No Follow Up Information / Referral to community resources:     Patient/Family's Response to care:  No issues or concerns identified at this time.1  Patient/Family's Understanding of and Emotional Response to Diagnosis, Current Treatment, and Prognosis:  Family has good understanding of diagnosis, current treatment and prognosis. No issues or concerns identified at this time.  Emotional Assessment Appearance:  Appears stated age Attitude/Demeanor/Rapport:   Other Affect (typically observed):  Calm, Quiet Orientation:    Alcohol / Substance use:  Not Applicable Psych involvement (Current and /or in the community):  No (Comment)  Discharge Needs  Concerns to be addressed:  Other (Comment Required Readmission within the last 30 days:  No Current discharge risk:  Terminally ill Barriers to Discharge:  No Barriers Identified   Normajean Baxter, LCSW 07/28/2016, 12:37 PM

## 2016-07-28 NOTE — Progress Notes (Signed)
Pt a transfer from 4 N to 5 M 22.

## 2016-08-04 DEATH — deceased

## 2017-11-06 IMAGING — CT CT HEAD W/O CM
4 series · 16 of 47 positions shown, 18 images · non-contrast
Comparison: Brain MRI 07/26/2016

CLINICAL DATA: Intracranial hemorrhage

EXAM:
CT HEAD WITHOUT CONTRAST
TECHNIQUE: Contiguous axial images were obtained from the base of the skull
through the vertex without intravenous contrast.

[Series 3: head bone · axial · 0.42mm/px · z∈[-200,-166]mm · 3 of 85 slices shown]
[im 9/85  bone]
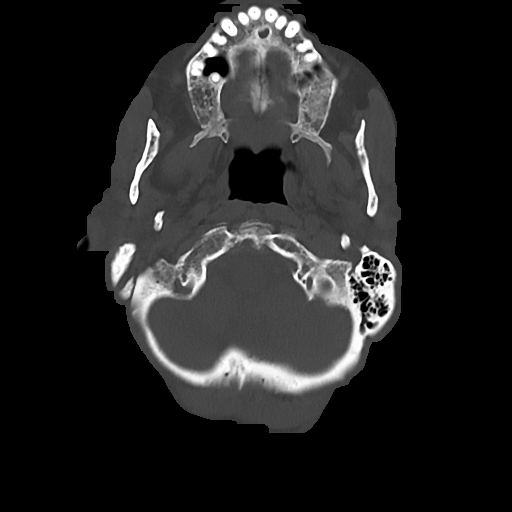
[im 17/85  bone]
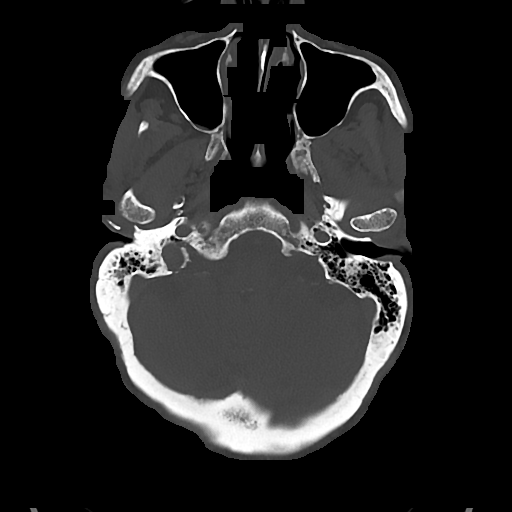
[im 26/85  bone]
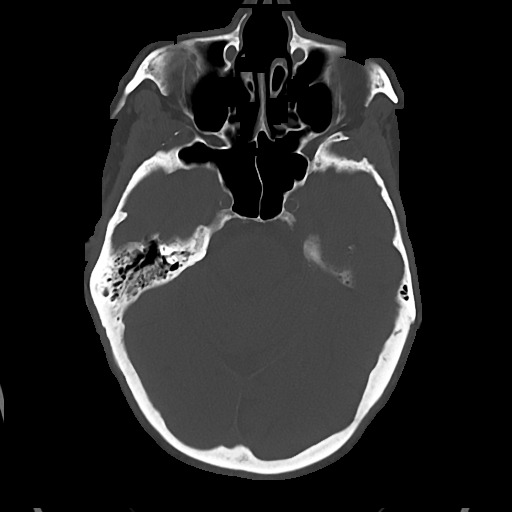

[Series 4: head wo · axial · 0.42mm/px · z∈[-196,-76]mm · 7 of 34 slices shown, 9 images]
[im 5/34  brain]
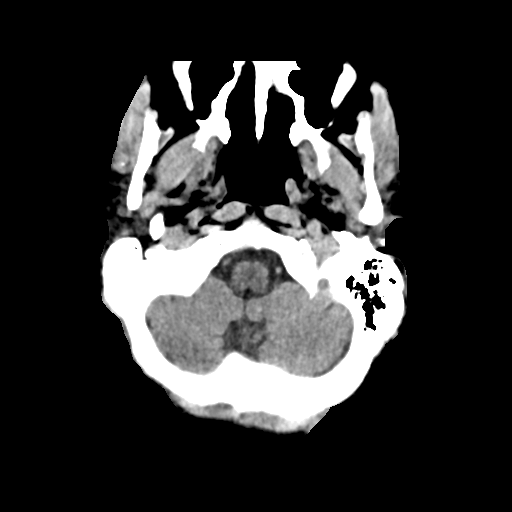
[im 5/34  bone]
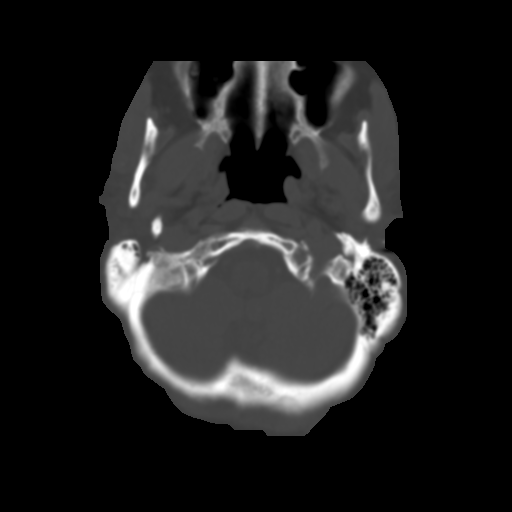
[im 9/34  brain]
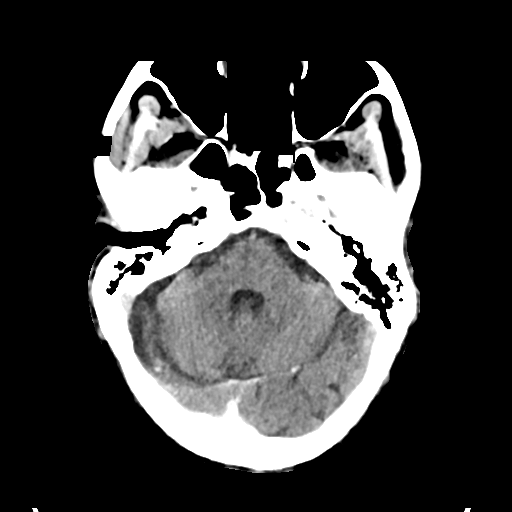
[im 13/34  brain]
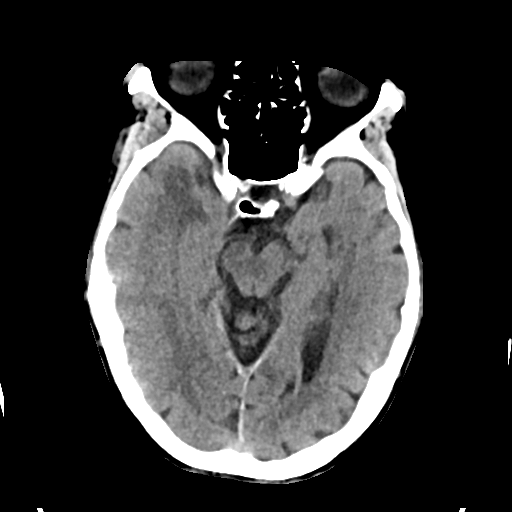
[im 17/34  brain]
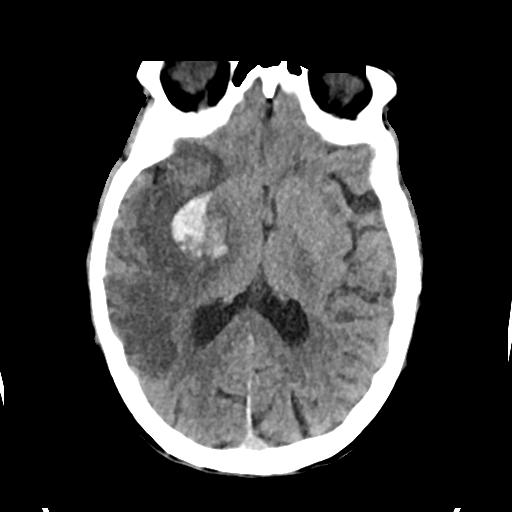
[im 21/34  brain]
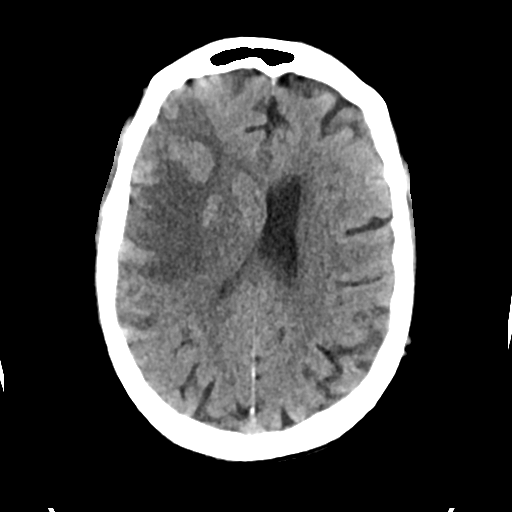
[im 21/34  bone]
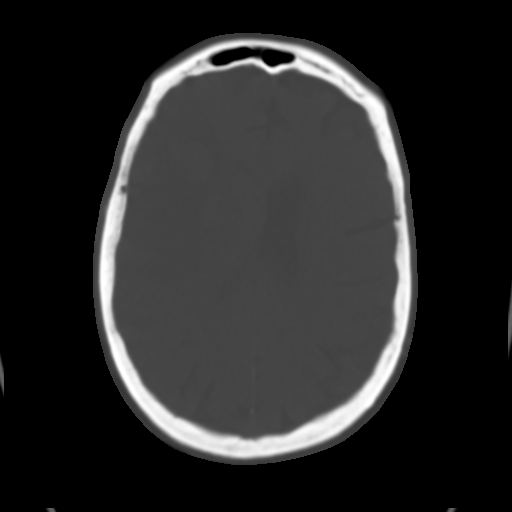
[im 25/34  brain]
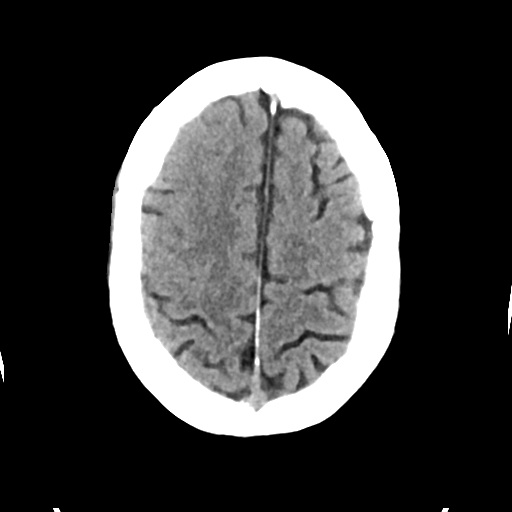
[im 29/34  brain]
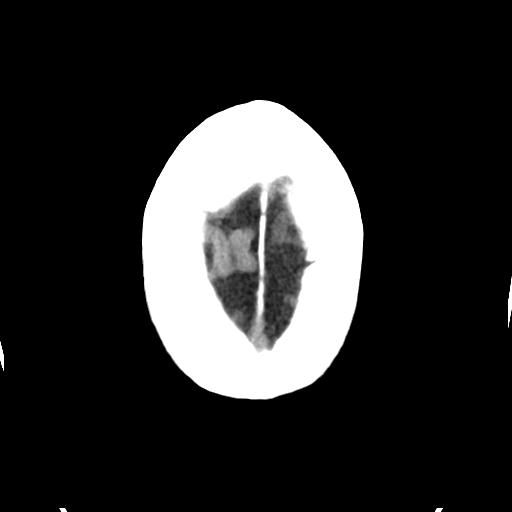

[Series 5: cor soft · coronal · 0.33mm/px · 3 of 66 slices shown]
[im 22/66  brain]
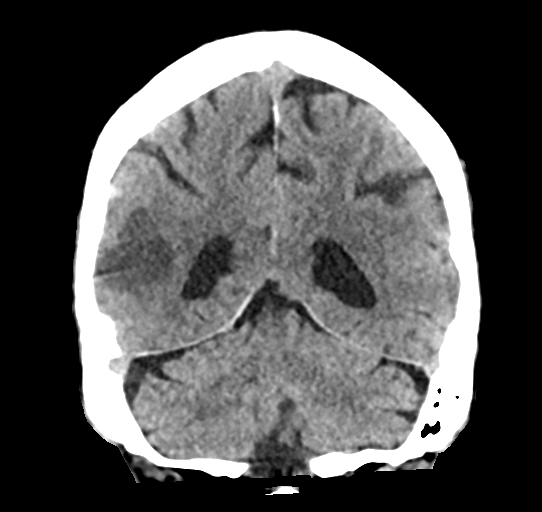
[im 29/66  brain]
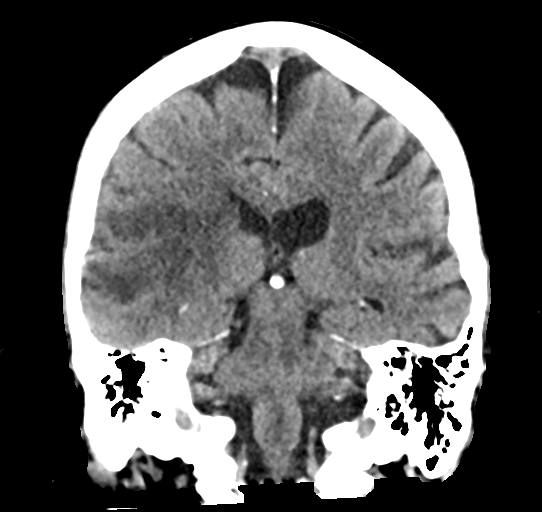
[im 37/66  brain]
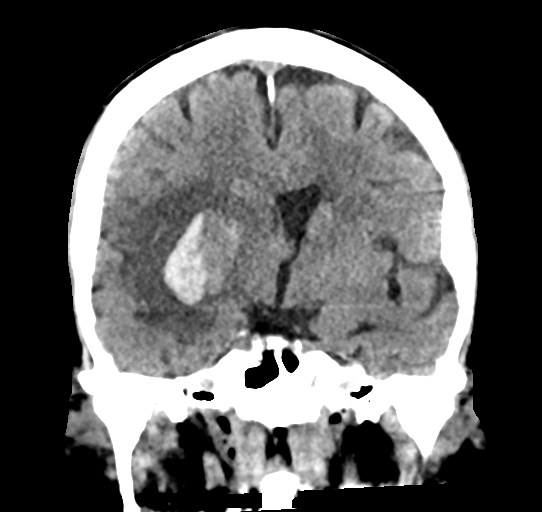

[Series 6: sag soft · sagittal · 0.33mm/px · 3 of 56 slices shown]
[im 19/56  brain]
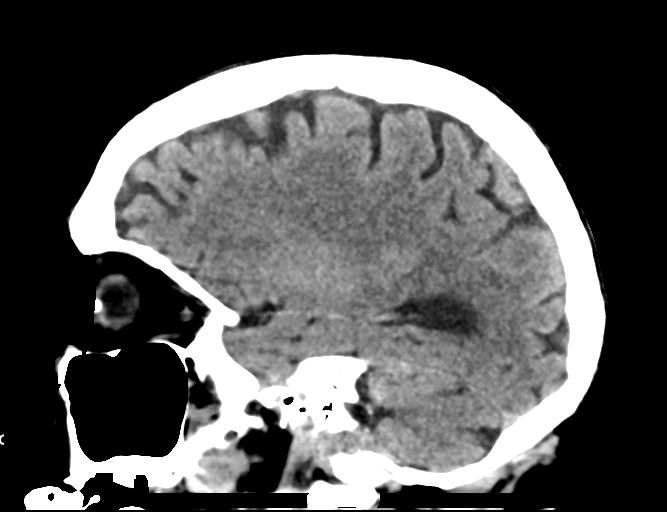
[im 28/56  brain]
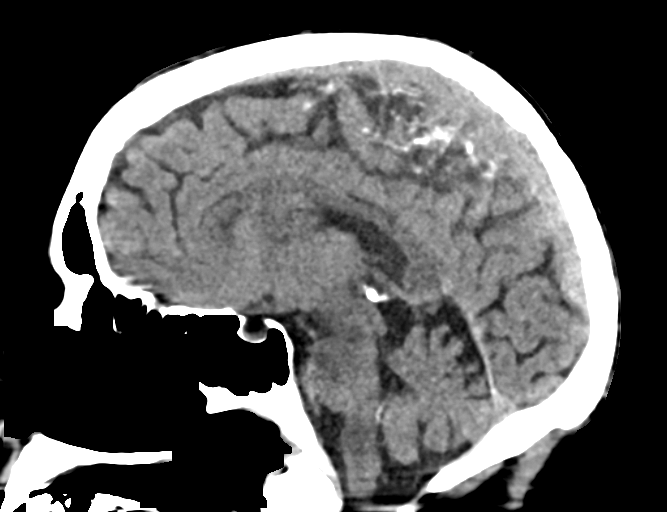
[im 37/56  brain]
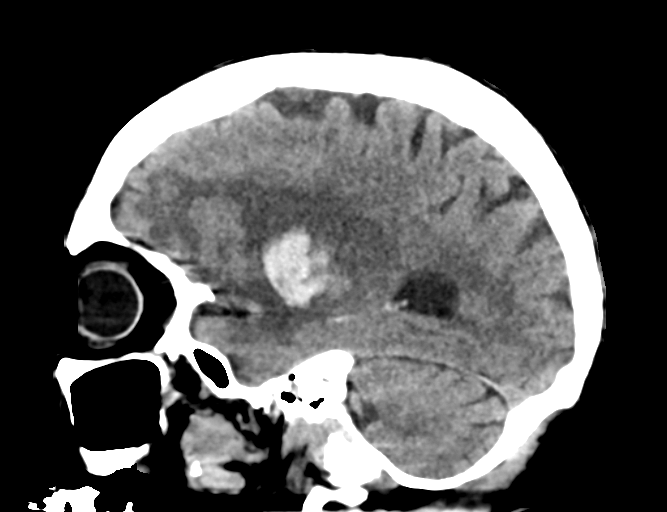

[16 of 47 positions shown; findings below may reference images not displayed]

FINDINGS: Brain: There is intraparenchymal hemorrhage centered in the right
basal ganglia that measures 3.2 x 2.4 cm, unchanged from the prior
MRI. There is a large amount of surrounding edema within the right
frontal, parietal and temporal lobes. There is unchanged mass effect
on the right lateral ventricle. No dilatation of the temporal horn.
At the level of the foramina of [place] there is leftward midline
shift measuring 3 mm, unchanged. There are no new areas of
hemorrhage. No frank herniation. Basal cisterns and foramen magnum
remain patent.

Vascular: No hyperdense vessel or unexpected calcification.

Skull: Normal visualized skull base, calvarium and extracranial soft
tissues.

Sinuses/Orbits: No sinus fluid levels or advanced mucosal
thickening. No mastoid effusion. Normal orbits.
IMPRESSION: 1. Hemorrhagic transformation of right MCA territory infarct with
unchanged size of intraparenchymal hematoma centered in the right
basal ganglia.
2. Unchanged mass effect on the right lateral ventricle without
ventricular trapping or hydrocephalus. Unchanged 3 mm leftward
midline shift.
